# Patient Record
Sex: Male | Born: 1970 | Race: White | Hispanic: No | Marital: Married | Smoking: Current some day smoker
Health system: Southern US, Community
[De-identification: ages and names within clinical notes are randomized; demographics above are authoritative.]

## PROBLEM LIST (undated history)

## (undated) DIAGNOSIS — F411 Generalized anxiety disorder: Secondary | ICD-10-CM

## (undated) DIAGNOSIS — Z973 Presence of spectacles and contact lenses: Secondary | ICD-10-CM

## (undated) DIAGNOSIS — N529 Male erectile dysfunction, unspecified: Secondary | ICD-10-CM

## (undated) DIAGNOSIS — F32A Depression, unspecified: Secondary | ICD-10-CM

## (undated) DIAGNOSIS — M1990 Unspecified osteoarthritis, unspecified site: Secondary | ICD-10-CM

## (undated) DIAGNOSIS — I10 Essential (primary) hypertension: Secondary | ICD-10-CM

## (undated) DIAGNOSIS — E785 Hyperlipidemia, unspecified: Secondary | ICD-10-CM

## (undated) DIAGNOSIS — K219 Gastro-esophageal reflux disease without esophagitis: Secondary | ICD-10-CM

---

## 2009-09-21 HISTORY — PX: BUNIONECTOMY: SHX129

## 2019-06-06 DIAGNOSIS — G5622 Lesion of ulnar nerve, left upper limb: Secondary | ICD-10-CM | POA: Insufficient documentation

## 2019-06-06 DIAGNOSIS — M79641 Pain in right hand: Secondary | ICD-10-CM | POA: Insufficient documentation

## 2019-06-09 ENCOUNTER — Other Ambulatory Visit: Payer: Self-pay

## 2019-06-09 DIAGNOSIS — R202 Paresthesia of skin: Secondary | ICD-10-CM

## 2019-06-15 ENCOUNTER — Ambulatory Visit: Payer: BC Managed Care – PPO | Admitting: Neurology

## 2019-06-15 ENCOUNTER — Other Ambulatory Visit: Payer: Self-pay

## 2019-06-15 DIAGNOSIS — R202 Paresthesia of skin: Secondary | ICD-10-CM | POA: Diagnosis not present

## 2019-06-15 DIAGNOSIS — G5623 Lesion of ulnar nerve, bilateral upper limbs: Secondary | ICD-10-CM

## 2019-06-15 DIAGNOSIS — G5602 Carpal tunnel syndrome, left upper limb: Secondary | ICD-10-CM

## 2019-06-15 NOTE — Procedures (Signed)
[facility]  [place]  [place], [place] [address] Tel: [phone] Fax:  [phone] Test Date:  06/15/2019  Patient: Jay Green DOB: March 31, 1971 Physician: Narda Amber, DO  Sex: Male Height: 6\' 0"  Ref Phys: Charlotte Crumb, MD  ID#: [id number] Temp: 32.0C Technician:    Patient Complaints: This is a 48 year old man referred for evaluation of left hand weakness and muscle atrophy.  NCV & EMG Findings: Extensive electrodiagnostic testing of the left upper extremity and additional studies of the right shows:  1. Left ulnar sensory response shows prolonged latency (3.5 ms) and reduced amplitude (3.5 V).  Left median sensory response shows reduced amplitude (14.8 V) and mixed palmar responses are prolonged.  Right median, ulnar, and mixed palmar sensory responses are within normal limits. 2. Left ulnar motor response at the abductor digit he minimi and first dorsal interosseous muscles shows prolonged latency (L3.2, L4.7 ms), reduced amplitude (L6.6, L1.4 mV), and decreased conduction velocity across the elbow (A Elbow-B Elbow, L42, L40 m/s).  Right ulnar motor response shows normal latency, normal amplitude, and slowed conduction velocity across the elbow  (A Elbow-B Elbow, 40 m/s).  Bilateral median motor responses are within normal limits.   3. Chronic motor axonal loss changes are seen affecting the ulnar innervated muscles on the left, with active denervation isolated to the first dorsal interosseous muscle.  These findings are not present in the right upper extremity.     Impression: 1. 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 2. 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 3. Left median neuropathy at or distal to the wrist (mild), consistent with a clinical diagnosis of carpal tunnel syndrome.    ___________________________  Narda Amber, DO    Nerve Conduction Studies Anti Sensory Summary Table   Site NR Peak (ms) Norm Peak (ms) P-T Amp (V) Norm P-T Amp  Left Median Anti Sensory (2nd Digit)  32C  Wrist    3.4 <3.6 14.8 >15  Right Median Anti Sensory (2nd Digit)  32C  Wrist    3.3 <3.6 16.7 >15  Left Ulnar Anti Sensory (5th Digit)  32C  Wrist    3.5 <3.1 3.5 >10  Right Ulnar Anti Sensory (5th Digit)  32C  Wrist    3.1 <3.1 12.0 >10   Motor Summary Table   Site NR Onset (ms) Norm Onset (ms) O-P Amp (mV) Norm O-P Amp Site1 Site2 Delta-0 (ms) Dist (cm) Vel (m/s) Norm Vel (m/s)  Left Median Motor (Abd Poll Brev)  32C  Wrist    3.4 <4.0 10.0 >6 Elbow Wrist 6.0 35.0 58 >50  Elbow    9.4  9.4         Right Median Motor (Abd Poll Brev)  32C  Wrist    3.5 <4.0 9.2 >6 Elbow Wrist 6.0 33.0 55 >50  Elbow    9.5  8.4         Left Ulnar Motor (Abd Dig Minimi)  32C  Wrist    3.2 <3.1 6.6 >7 B Elbow Wrist 5.3 27.0 51 >50  B Elbow    8.5  5.4  A Elbow B Elbow 2.4 10.0 42 >50  A Elbow    10.9  5.3         Right Ulnar Motor (Abd Dig Minimi)  32C  Wrist    2.1 <3.1 10.5 >7 B Elbow Wrist 5.0 27.0 54 >  50  B Elbow    7.1  9.2  A Elbow B Elbow 2.5 10.0 40 >50  A Elbow    9.6  9.2         Left Ulnar (FDI) Motor (1st DI)  32C  Wrist    4.7 <4.5 1.4 >7 B Elbow Wrist 5.3 27.0 51 >50  B Elbow    10.0  0.9  A Elbow B Elbow 2.5 10.0 40 >50  A Elbow    12.5  0.8          Comparison Summary Table   Site NR Peak (ms) Norm Peak (ms) P-T Amp (V) Site1 Site2 Delta-P (ms) Norm Delta (ms)  Left Median/Ulnar Palm Comparison (Wrist - 8cm)  32C  Median Palm    2.7 <2.2 13.8 Median Palm Ulnar Palm    Ulnar Palm NR  <2.2       Right Median/Ulnar Palm Comparison (Wrist - 8cm)  32C  Median Palm    2.1 <2.2 21.1 Median Palm Ulnar Palm 0.3   Ulnar Palm    1.8 <2.2 9.4       EMG   Side Muscle Ins Act Fibs Psw Fasc Number Recrt Dur Dur. Amp Amp. Poly Poly. Comment  Left 1stDorInt Nml 1+ Nml Nml 3- Rapid All 1+ Nml Nml  Nml Nml N/A  Left ABD Dig Min Nml Nml Nml Nml 3- Rapid Most 1+ Most 1+ Nml Nml N/A  Left FlexCarpiUln Nml Nml Nml Nml 1- Rapid Some 1+ Some 1+ Nml Nml N/A  Left Ext Indicis Nml Nml Nml Nml Nml Nml Nml Nml Nml Nml Nml Nml N/A  Left PronatorTeres Nml Nml Nml Nml Nml Nml Nml Nml Nml Nml Nml Nml N/A  Left Biceps Nml Nml Nml Nml Nml Nml Nml Nml Nml Nml Nml Nml N/A  Left Triceps Nml Nml Nml Nml Nml Nml Nml Nml Nml Nml Nml Nml N/A  Left Deltoid Nml Nml Nml Nml Nml Nml Nml Nml Nml Nml Nml Nml N/A  Right 1stDorInt Nml Nml Nml Nml Nml Nml Nml Nml Nml Nml Nml Nml N/A  Right Ext Indicis Nml Nml Nml Nml Nml Nml Nml Nml Nml Nml Nml Nml N/A  Right PronatorTeres Nml Nml Nml Nml Nml Nml Nml Nml Nml Nml Nml Nml N/A  Right ABD Dig Min Nml Nml Nml Nml Nml Nml Nml Nml Nml Nml Nml Nml N/A  Right FlexCarpiUln Nml Nml Nml Nml Nml Nml Nml Nml Nml Nml Nml Nml N/A  Left Abd Poll Brev Nml Nml Nml Nml Nml Nml Nml Nml Nml Nml Nml Nml N/A      Waveforms:

## 2019-07-23 HISTORY — PX: CARPAL TUNNEL RELEASE: SHX101

## 2021-07-22 DIAGNOSIS — C61 Malignant neoplasm of prostate: Secondary | ICD-10-CM

## 2021-07-22 HISTORY — DX: Malignant neoplasm of prostate: C61

## 2021-10-08 ENCOUNTER — Other Ambulatory Visit: Payer: Self-pay | Admitting: Urology

## 2021-10-08 DIAGNOSIS — C61 Malignant neoplasm of prostate: Secondary | ICD-10-CM

## 2021-10-18 ENCOUNTER — Ambulatory Visit
Admission: RE | Admit: 2021-10-18 | Discharge: 2021-10-18 | Disposition: A | Discharge status: Home / Self Care | Payer: BC Managed Care – PPO | Source: Ambulatory Visit | Attending: Urology | Admitting: Urology

## 2021-10-18 ENCOUNTER — Other Ambulatory Visit: Payer: Self-pay

## 2021-10-18 DIAGNOSIS — C61 Malignant neoplasm of prostate: Secondary | ICD-10-CM

## 2021-10-18 MED ORDER — GADOBENATE DIMEGLUMINE 529 MG/ML IV SOLN
20.0000 mL | Freq: Once | INTRAVENOUS | Status: AC | PRN
  Administered 2021-10-18: 20 mL via INTRAVENOUS

## 2021-12-03 ENCOUNTER — Other Ambulatory Visit (HOSPITAL_COMMUNITY): Payer: Self-pay | Admitting: Urology

## 2021-12-03 ENCOUNTER — Telehealth: Payer: Self-pay | Admitting: Radiation Oncology

## 2021-12-03 DIAGNOSIS — C61 Malignant neoplasm of prostate: Secondary | ICD-10-CM

## 2021-12-03 NOTE — Progress Notes (Incomplete)
GU Location of Tumor / Histology: Prostate Ca ? ?If Prostate Cancer, Gleason Score is (4 + 3) and PSA is (11.7 as of 10/2021) ? ?Biopsies: ?Dr. Alinda Money ? ? ? ? ? ?Past/Anticipated interventions by urology, if any:  ? ?Past/Anticipated interventions by medical oncology, if any:  ? ?Weight changes, if any:  ? ?IPSS: ?SHIM: ? ?Bowel/Bladder complaints, if any:   ? ?Nausea/Vomiting, if any:  ? ?Pain issues, if any:   ? ?SAFETY ISSUES: ?Prior radiation?  ?Pacemaker/ICD?  ?Possible current pregnancy? Male ?Is the patient on methotrexate?  ? ?Current Complaints / other details:   ?

## 2021-12-03 NOTE — Telephone Encounter (Signed)
Called patient to r/s appointment scheduled 3/21 w. Dr. Tammi Klippel. No answer, LVM for a return call.   ?

## 2021-12-08 NOTE — Progress Notes (Signed)
GU Location of Tumor / Histology: Prostate Ca ? ?If Prostate Cancer, Gleason Score is (4 + 3) and PSA is (11.7 as of 09/2021) ? ?Biopsies: ?Dr. Alinda Money ? ? ? ? ? ?Past/Anticipated interventions by urology, if any:  ?NA ?Past/Anticipated interventions by medical oncology, if any: NA ? ?Weight changes, if any:  No ? ?IPSS:  11 ?SHIM:  17 ? ?Bowel/Bladder complaints, if any:  No ? ?Nausea/Vomiting, if any: No ? ?Pain issues, if any:  0/10 ? ?SAFETY ISSUES: ?Prior radiation?  No ?Pacemaker/ICD? No ?Possible current pregnancy? Male ?Is the patient on methotrexate? No ? ?Current Complaints / other details:  Need more information on treatment options. ?

## 2021-12-09 ENCOUNTER — Ambulatory Visit: Payer: BC Managed Care – PPO

## 2021-12-09 ENCOUNTER — Ambulatory Visit: Payer: BC Managed Care – PPO | Admitting: Radiation Oncology

## 2021-12-10 ENCOUNTER — Other Ambulatory Visit: Payer: Self-pay

## 2021-12-10 ENCOUNTER — Encounter (HOSPITAL_COMMUNITY)
Admission: RE | Admit: 2021-12-10 | Discharge: 2021-12-10 | Disposition: A | Discharge status: Home / Self Care | Payer: BC Managed Care – PPO | Source: Ambulatory Visit | Attending: Urology | Admitting: Urology

## 2021-12-10 DIAGNOSIS — C61 Malignant neoplasm of prostate: Secondary | ICD-10-CM | POA: Diagnosis present

## 2021-12-10 MED ORDER — PIFLIFOLASTAT F 18 (PYLARIFY) INJECTION
9.0000 | Freq: Once | INTRAVENOUS | Status: AC
  Administered 2021-12-10: 9.3 via INTRAVENOUS

## 2021-12-16 ENCOUNTER — Ambulatory Visit
Admission: RE | Admit: 2021-12-16 | Discharge: 2021-12-16 | Disposition: A | Discharge status: Home / Self Care | Payer: BC Managed Care – PPO | Source: Ambulatory Visit | Attending: Radiation Oncology | Admitting: Radiation Oncology

## 2021-12-16 ENCOUNTER — Other Ambulatory Visit: Payer: Self-pay

## 2021-12-16 VITALS — BP 135/96 | HR 72 | Temp 98.1°F | Resp 18 | Ht 72.0 in | Wt 217.0 lb

## 2021-12-16 DIAGNOSIS — M19041 Primary osteoarthritis, right hand: Secondary | ICD-10-CM | POA: Insufficient documentation

## 2021-12-16 DIAGNOSIS — Z79899 Other long term (current) drug therapy: Secondary | ICD-10-CM | POA: Insufficient documentation

## 2021-12-16 DIAGNOSIS — F341 Dysthymic disorder: Secondary | ICD-10-CM | POA: Insufficient documentation

## 2021-12-16 DIAGNOSIS — C61 Malignant neoplasm of prostate: Secondary | ICD-10-CM | POA: Insufficient documentation

## 2021-12-16 DIAGNOSIS — K5730 Diverticulosis of large intestine without perforation or abscess without bleeding: Secondary | ICD-10-CM | POA: Diagnosis not present

## 2021-12-16 DIAGNOSIS — I10 Essential (primary) hypertension: Secondary | ICD-10-CM | POA: Insufficient documentation

## 2021-12-16 DIAGNOSIS — K219 Gastro-esophageal reflux disease without esophagitis: Secondary | ICD-10-CM | POA: Insufficient documentation

## 2021-12-16 DIAGNOSIS — K589 Irritable bowel syndrome without diarrhea: Secondary | ICD-10-CM | POA: Insufficient documentation

## 2021-12-16 DIAGNOSIS — E785 Hyperlipidemia, unspecified: Secondary | ICD-10-CM | POA: Insufficient documentation

## 2021-12-16 DIAGNOSIS — R251 Tremor, unspecified: Secondary | ICD-10-CM | POA: Insufficient documentation

## 2021-12-16 DIAGNOSIS — R748 Abnormal levels of other serum enzymes: Secondary | ICD-10-CM | POA: Insufficient documentation

## 2021-12-16 NOTE — Progress Notes (Signed)
Introduced myself to the patient, and his wife, as the prostate nurse navigator.  No barriers to care identified at this time.  He is here to discuss his radiation treatment options, and is most interested in boost brachytherapy, followed by 5.5 weeks of EBRT. He verbalized concerns with applying for short term disability, I have provided patient with contact information to submit paperwork. I gave him my business card and asked him to call me with questions or concerns.  Verbalized understanding.  ?

## 2021-12-16 NOTE — Progress Notes (Signed)
?Radiation Oncology         (336) [phone] ?________________________________ ? ?Initial Outpatient Consultation ? ?Name: Jay Green MRN: [id number]  ?Date: 12/16/2021  DOB: 1970-10-18 ? ?YQ:MVHQIO, Provider Not In  Raynelle Bring, MD  ? ?REFERRING PHYSICIAN: Raynelle Bring, MD ? ?DIAGNOSIS: 51 y.o. gentleman with Stage T1c adenocarcinoma of the prostate with Gleason score of 3+4, and PSA of 11.7. ? ?  ICD-10-CM   ?1. Malignant neoplasm of prostate ([place])  C61   ?  ? ? ?HISTORY OF PRESENT ILLNESS: Jay Green is a 51 y.o. male with a diagnosis of prostate cancer. He was initially diagnosed with low volume Gleason 3+3 prostate cancer in 2/12 cores on by Dr. Joya Gaskins in [place], [place] on 08/18/21. PSA at that time was 16.7. He was subsequently referred to Dr. Alinda Money on 10/07/21 to discuss prostatectomy, digital rectal examination was performed at that time revealing no nodules. Repeat PSA from that day remained elevated with a slight decrease to 11.7. Given his significantly elevated PSA, Dr. Alinda Money recommended prostate MRI to rule out more significant disease. The prostate MRI was performed on 10/18/21 and showed a 1.4 cm PI-RADS 4 in the right lateral mid gland peripheral zone as well as a 2.9 cm PI-RADS 5 lesion involving the left anterior transition zone and fibromuscular stroma in the mid gland and base without any definite evidence of extracapsular extension or pelvic metastatic disease. The patient proceeded to MRI fusion biopsy of the prostate on 2/29/23.  The prostate volume measured 24.2 cc.  Out of 20 core biopsies, 16 were positive.  The maximum Gleason score was 3+4, and this was seen in the right mid lateral, right base, and all four samples from ROI #1. Additionally, Gleason 3+3 was seen in three (of four) cores from ROI #2, as well as the left base lateral, left mid lateral (small focus), left apex lateral, left mid, left apex, right mid, and right apex lateral (small focus). ? ?8 PSMA PET scan was performed on  12/10/21 for disease staging and this showed two foci of abnormal radiotracer uptake in the prostate gland corresponding with the suspicious lesions seen on prior MRI.  There was no abnormal extraprostatic radiotracer activity to suggest metastatic disease. ? ?The patient reviewed the biopsy results with his urologist and he has kindly been referred today for discussion of potential radiation treatment options. ? ? ?PREVIOUS RADIATION THERAPY: No ? ?PAST MEDICAL HISTORY: History reviewed. No pertinent past medical history.   ? ?PAST SURGICAL HISTORY:History reviewed. No pertinent surgical history. ? ?FAMILY HISTORY: History reviewed. No pertinent family history. ? ?SOCIAL HISTORY: He works full-time in Theatre manager for [employer].  He also enjoys gardening and working on the farm. ?Social History  ? ?Socioeconomic History  ? Marital status: Married  ?  Spouse name: Not on file  ? Number of children: Not on file  ? Years of education: Not on file  ? Highest education level: Not on file  ?Occupational History  ? Not on file  ?Tobacco Use  ? Smoking status: Not on file  ? Smokeless tobacco: Not on file  ?Substance and Sexual Activity  ? Alcohol use: Not on file  ? Drug use: Not on file  ? Sexual activity: Not on file  ?Other Topics Concern  ? Not on file  ?Social History Narrative  ? Not on file  ? ?Social Determinants of Health  ? ?Financial Resource Strain: Not on file  ?Food Insecurity: Not on file  ?Transportation Needs: Not on  file  ?Physical Activity: Not on file  ?Stress: Not on file  ?Social Connections: Not on file  ?Intimate Partner Violence: Not on file  ? ? ?ALLERGIES: Lisinopril and Sulfamethoxazole-trimethoprim ? ?MEDICATIONS:  ?Current Outpatient Medications  ?Medication Sig Dispense Refill  ? amLODipine (NORVASC) 5 MG tablet Take 5 mg by mouth daily.    ? clonazePAM (KLONOPIN) 0.5 MG tablet Take by mouth.    ? escitalopram (LEXAPRO) 10 MG tablet Take 10 mg by mouth daily.    ? omeprazole (PRILOSEC) 40 MG  capsule Take by mouth.    ? rosuvastatin (CRESTOR) 10 MG tablet Take 10 mg by mouth daily.    ? ?No current facility-administered medications for this encounter.  ? ? ?REVIEW OF SYSTEMS:  On review of systems, the patient reports that he is doing well overall. He denies any chest pain, shortness of breath, cough, fevers, chills, night sweats, unintended weight changes. He denies any bowel disturbances, and denies abdominal pain, nausea or vomiting. He denies any new musculoskeletal or joint aches or pains. His IPSS was 11, indicating mild-moderate urinary symptoms. His SHIM was 17, indicating he has moderate erectile dysfunction. A complete review of systems is obtained and is otherwise negative. ? ?  ?PHYSICAL EXAM:  ?Wt Readings from Last 3 Encounters:  ?12/16/21 217 lb (98.4 kg)  ?12/10/21 210 lb 6.9 oz (95.5 kg)  ? ?Temp Readings from Last 3 Encounters:  ?12/16/21 98.1 ?F (36.7 ?C)  ? ?BP Readings from Last 3 Encounters:  ?12/16/21 (!) 135/96  ? ?Pulse Readings from Last 3 Encounters:  ?12/16/21 72  ? ?Pain Assessment ?Pain Score: 0-No pain/10 ? ?In general this is a well appearing Caucasian male in no acute distress. He's alert and oriented x4 and appropriate throughout the examination. Cardiopulmonary assessment is negative for acute distress, and he exhibits normal effort.   ? ? ?KPS = 100 ? ?100 - Normal; no complaints; no evidence of disease. ?90   - Able to carry on normal activity; minor signs or symptoms of disease. ?80   - Normal activity with effort; some signs or symptoms of disease. ?106   - Cares for self; unable to carry on normal activity or to do active work. ?60   - Requires occasional assistance, but is able to care for most of his personal needs. ?50   - Requires considerable assistance and frequent medical care. ?43   - Disabled; requires special care and assistance. ?30   - Severely disabled; hospital admission is indicated although death not imminent. ?20   - Very sick; hospital admission  necessary; active supportive treatment necessary. ?10   - Moribund; fatal processes progressing rapidly. ?0     - Dead ? ?Karnofsky DA, Abelmann WH, Craver LS and Burchenal [facility] [phone]) The use of the nitrogen mustards in the palliative treatment of carcinoma: with particular reference to bronchogenic carcinoma Cancer 1 634-56 ? ?LABORATORY DATA:  ?No results found for: WBC, HGB, HCT, MCV, PLT ?No results found for: NA, K, CL, CO2 ?No results found for: ALT, AST, GGT, ALKPHOS, BILITOT ?  ?RADIOGRAPHY: NM PET (PSMA) SKULL TO MID THIGH ? ?Result Date: 12/12/2021 ?CLINICAL DATA:  Initial treatment strategy for tumor type. EXAM: NUCLEAR MEDICINE PET SKULL BASE TO THIGH TECHNIQUE: 9.3 mCi F18 Piflufolastat (Pylarify) was injected intravenously. Full-ring PET imaging was performed from the skull base to thigh after the radiotracer. CT data was obtained and used for attenuation correction and anatomic localization. COMPARISON:  MRI October 18, 2021 FINDINGS: Background  hepatic uptake demonstrated mass SUV of 8.2. Background parotid gland uptake demonstrates a Jay SUV of 15.7 NECK No radiotracer activity in neck lymph nodes. Incidental CT finding: Streak artifact from dental hardware CHEST No radiotracer accumulation within mediastinal or hilar lymph nodes. No suspicious pulmonary nodules on the CT scan. Incidental CT finding: None ABDOMEN/PELVIS Prostate: There are 2 intense foci of abnormal radiotracer uptake in the prostate gland, with uptake similar to that of background liver, both of which appear to correspond with the suspicious PI-RADS 4/5 nodules seen on prior MRI. The Jay SUV of the focus in the left gland is 8.6 in the Jay SUV of the focus in the right gland a 7 point Lymph nodes: No abnormal radiotracer accumulation within pelvic or abdominal nodes. Liver: No evidence of liver metastasis Incidental CT finding: Unremarkable noncontrast appearance of the hepatic, pancreatic and splenic parenchyma. No hydronephrosis.  Scattered colonic diverticulosis without findings of acute diverticulitis. SKELETON No focal  activity to suggest skeletal metastasis. IMPRESSION: 1. Two foci of abnormal radiotracer uptake in the pros

## 2021-12-19 ENCOUNTER — Telehealth: Payer: Self-pay | Admitting: *Deleted

## 2021-12-19 NOTE — Telephone Encounter (Signed)
CALLED PATIENT TO UPDATE, SPOKE WITH PATIENT 

## 2021-12-21 ENCOUNTER — Encounter: Payer: Self-pay | Admitting: Radiation Oncology

## 2021-12-31 ENCOUNTER — Telehealth: Payer: Self-pay | Admitting: *Deleted

## 2021-12-31 NOTE — Telephone Encounter (Signed)
Called patient to inform of implant date of 03/19/22 @ 10:30 am, lvm for a return call ?

## 2022-01-01 ENCOUNTER — Other Ambulatory Visit: Payer: Self-pay | Admitting: Urology

## 2022-01-14 ENCOUNTER — Telehealth: Payer: Self-pay | Admitting: *Deleted

## 2022-01-14 NOTE — Telephone Encounter (Signed)
RETURNED PATIENT'S PHONE CALL, SPOKE WITH PATIENT. ?

## 2022-01-14 NOTE — Telephone Encounter (Signed)
CALLED PATIENT TO GIVE INFO., LVM FOR A RETURN CALL 

## 2022-01-21 ENCOUNTER — Telehealth: Payer: Self-pay | Admitting: *Deleted

## 2022-01-21 NOTE — Telephone Encounter (Signed)
CALLED PATIENT TO REMIND OF PRE-SEED APPTS. FOR 01-22-22, SPOKE WITH PATIENT'S WIFE- Jay Green AND SHE IS AWARE OF THESE APPTS. ?

## 2022-01-22 ENCOUNTER — Encounter (HOSPITAL_COMMUNITY)
Admission: RE | Admit: 2022-01-22 | Discharge: 2022-01-22 | Disposition: A | Discharge status: Home / Self Care | Payer: BC Managed Care – PPO | Source: Ambulatory Visit | Attending: Urology | Admitting: Urology

## 2022-01-22 ENCOUNTER — Encounter: Payer: Self-pay | Admitting: Urology

## 2022-01-22 ENCOUNTER — Ambulatory Visit
Admission: RE | Admit: 2022-01-22 | Discharge: 2022-01-22 | Disposition: A | Discharge status: Home / Self Care | Payer: BC Managed Care – PPO | Source: Ambulatory Visit | Attending: Urology | Admitting: Urology

## 2022-01-22 ENCOUNTER — Other Ambulatory Visit: Payer: Self-pay

## 2022-01-22 ENCOUNTER — Ambulatory Visit
Admission: RE | Admit: 2022-01-22 | Discharge: 2022-01-22 | Disposition: A | Discharge status: Home / Self Care | Payer: BC Managed Care – PPO | Source: Ambulatory Visit | Attending: Radiation Oncology | Admitting: Radiation Oncology

## 2022-01-22 VITALS — Resp 19 | Ht 72.0 in | Wt 212.8 lb

## 2022-01-22 DIAGNOSIS — C61 Malignant neoplasm of prostate: Secondary | ICD-10-CM

## 2022-01-22 DIAGNOSIS — I10 Essential (primary) hypertension: Secondary | ICD-10-CM | POA: Insufficient documentation

## 2022-01-22 DIAGNOSIS — Z01810 Encounter for preprocedural cardiovascular examination: Secondary | ICD-10-CM | POA: Insufficient documentation

## 2022-01-22 NOTE — Progress Notes (Addendum)
Pre-Seed appointment. I verified patient identity and began nursing interview. ?Patient is doing well. No prostate related issues or discomfort reported at this time. ? ?Meaningful use complete. ?No urinary management medications currently. ?Urology appointment March 19, 2022- per patient. ? ?Resp 19   Ht 6' (1.829 m)   Wt 212 lb 12.8 oz (96.5 kg)   BMI 28.86 kg/m?  ? ?

## 2022-01-22 NOTE — Progress Notes (Signed)
?  Radiation Oncology         (336) [phone] ?________________________________ ? ?Name: Jay Green MRN: [id number]  ?Date: 01/22/2022  DOB: 1971-06-16 ? ?SIMULATION AND TREATMENT PLANNING NOTE ?PUBIC ARCH STUDY ? ?RS:WNIOEV, Provider Not In  Raynelle Bring, MD ? ?DIAGNOSIS:  51 y.o. gentleman with Stage T1c adenocarcinoma of the prostate with Gleason score of 3+4, and PSA of 11.7. ? ?Oncology History  ?Malignant neoplasm of prostate ([place])  ?11/13/2021 Cancer Staging  ? Staging form: Prostate, AJCC 8th Edition ?- Clinical stage from 11/13/2021: Stage IIB (cT1c, cN0, cM0, PSA: 11.7, Grade Group: 2) - Signed by Freeman Caldron, PA-C on 12/16/2021 ?Histopathologic type: Adenocarcinoma, NOS ?Stage prefix: Initial diagnosis ?Prostate specific antigen (PSA) range: 10 to 19 ?Gleason primary pattern: 3 ?Gleason secondary pattern: 4 ?Gleason score: 7 ?Histologic grading system: 5 grade system ?Number of biopsy cores examined: 20 ?Number of biopsy cores positive: 16 ?Location of positive needle core biopsies: Both sides ? ?  ?12/16/2021 Initial Diagnosis  ? Malignant neoplasm of prostate ([place]) ? ?  ? ? ?  ICD-10-CM   ?1. Malignant neoplasm of prostate ([place])  C61   ?  ? ? ?COMPLEX SIMULATION:  The patient presented today for evaluation for possible prostate seed implant. He was brought to the radiation planning suite and placed supine on the CT couch. A 3-dimensional image study set was obtained in upload to the planning computer. There, on each axial slice, I contoured the prostate gland. Then, using three-dimensional radiation planning tools I reconstructed the prostate in view of the structures from the transperineal needle pathway to assess for possible pubic arch interference. In doing so, I did not appreciate any pubic arch interference. Also, the patient's prostate volume was estimated based on the drawn structure. The volume was 24 cc.  Given the pubic arch appearance and prostate volume, patient remains a good candidate to  proceed with prostate seed implant. Today, he freely provided informed written consent to proceed. ? ? ? ?PLAN: The patient will undergo prostate seed implant. ? ? ?________________________________ ? ?Sheral Apley Tammi Klippel, M.D. ? ? ? ? ?

## 2022-01-23 ENCOUNTER — Telehealth: Payer: Self-pay | Admitting: *Deleted

## 2022-01-23 NOTE — Telephone Encounter (Signed)
CALLED PATIENT TO ASK IF HE HAS ANY QUESTIONS REGARDING INFO THAT HE WAS GIVEN YESTERDAY, SPOKE WITH PATIENT ?

## 2022-03-13 ENCOUNTER — Encounter (HOSPITAL_BASED_OUTPATIENT_CLINIC_OR_DEPARTMENT_OTHER): Payer: Self-pay | Admitting: Urology

## 2022-03-18 ENCOUNTER — Telehealth: Payer: Self-pay | Admitting: *Deleted

## 2022-03-18 NOTE — Telephone Encounter (Signed)
CALLED PATIENT TO REMIND OF PROCEDURE FOR 03-19-22, SPOKE WITH PATIENT AND HE IS AWARE OF THIS PROCEDURE

## 2022-03-18 NOTE — H&P (Signed)
Office Visit Report     03/03/2022   --------------------------------------------------------------------------------   Jay Green  MRN: [id number]  DOB: 07-27-1971, 51 year old Male  SSN:    PRIMARY CARE:    REFERRING:  Burnett Harry. Joya Gaskins, MD  PROVIDER:  Raynelle Bring, M.D.  TREATING:  Mcarthur Rossetti, [place]  LOCATION:  [facility] [phone]     --------------------------------------------------------------------------------   CC/HPI: Pt presents today for pre-operative history and physical exam in anticipation of space oar, radiation seed placement, and cysto by Dr. Alinda Money on 03/19/22. He is doing well and is without complaint.   Pt denies F/C, HA, CP, SOB, N/V, diarrhea/constipation, back pain, flank pain, hematuria, and dysuria.    HX:   CC: Prostate cancer   Physician requesting consult: Dr. Harle Stanford  PCP: Dr. Haynes Dage   Mr. Jay Green is a 51 year old gentleman who was noted to have a persistently elevated PSA of 16.7 in October of 2022 prompting a TRUS biopsy of the prostate on 08/19/21. This indicated Gleason 3+3=6 adenocarcinoma in 10% of the tissue from the left apical biopsies. He was seen by me in consultation in January 2023 after discussion, he wished to proceed with additional diagnostic evaluation. An MRI of the prostate on 10/18/21 that demonstrated two concerning lesions including a large anterior PI-RADS 5 lesion. An MR/US fusion biopsy was then performed on 11/13/21 and confirmed Gleason 3+4=7 adenocarcinoma with 7 of 8 targeted biopsies positive but also 9 out of 12 systematic biopsies positive for malignancy. His repeated PSA on 10/07/21 was 11.7.   Family history: None.   Imaging studies: MRI (10/18/21): No EPE, SVI, LAD, or bone lesions.   PMH: He has a history of GERD, hypertension, depression, and hyperlipidemia.  PSH: No abdominal surgeries.   TNM stage: cT1c Nx Mx  PSA: 11.7  Gleason score: 3+4=7 (GG 2)  Biopsy  (11/13/21): 16/20 cores positive  Left: L lateral apex (80%, 3+3=6), L apex (60%, 3+3=6), L lateral mid (5%, 3+3=6), L mid (90%, 3+3=6), L lateral base (30%, 3+3=6)  Right: R lateral apex (5%, 3+3=6), R mid (50%, 3+3=6), R lateral mid (30%, 3+4=7), R base (40%, 3+4=7)  MR targets: ROI-1 (4/4 cores - 40%, 50%, 40%, 40%, 3+4=7), ROI-2 (3/4 cores - 90%, 50%, 50%, 3+3=6)  Prostate volume: 24.2 cc   Nomogram  OC disease: 35%  EPE: 63%  SVI: 11%  LNI: 11%  PFS (5 year, 10 year): 67%, 52%   Urinary function: IPSS is 3.  Erectile function: SHIM score is 17. He can achieve erections for intercourse most of the time but not always. He has recently started using sildenafil and believes this is helpful.     ALLERGIES: No Known Drug Allergies    MEDICATIONS: Omeprazole 40 mg capsule,delayed release  Amlodipine Besylate 5 mg tablet  Escitalopram Oxalate 10 mg tablet  Rosuvastatin Calcium 10 mg tablet     GU PSH: Prostate Needle Biopsy - 11/13/2021       PSH Notes: left foot bunion and hammer toe repair 15 years ago    NON-GU PSH: Carpal tunnel surgery - 2021 Surgical Pathology, Gross And Microscopic Examination For Prostate Needle - 11/13/2021     GU PMH: Prostate Cancer - 11/28/2021, - 11/13/2021, - 10/07/2021    NON-GU PMH: Anxiety GERD Hypercholesterolemia Hypertension    FAMILY HISTORY: 2 sons - Runs in Family   SOCIAL HISTORY: Marital Status: Married Ethnicity: Not Hispanic Or Latino; Race: White Current Smoking Status: Patient  does not smoke anymore. Has not smoked since 09/21/2013. Smoked for 20 years.   Tobacco Use Assessment Completed: Used Tobacco in last 30 days? Uses smokeless tobacco. Does drink.  Does not use drugs. Drinks 4+ caffeinated drinks per day. Has not had a blood transfusion. Patient's occupation Tour manager.     Notes: vaping daily  chew tobacco 1 can every 3-4 days  ETOH whiskey 1/2 gal per week; beer 6 pack per week    REVIEW OF  SYSTEMS:    GU Review Male:   Patient denies frequent urination, hard to postpone urination, burning/ pain with urination, get up at night to urinate, leakage of urine, stream starts and stops, trouble starting your stream, have to strain to urinate , erection problems, and penile pain.  Gastrointestinal (Upper):   Patient reports indigestion/ heartburn. Patient denies nausea and vomiting.  Gastrointestinal (Lower):   Patient denies diarrhea and constipation.  Constitutional:   Patient denies fever, night sweats, weight loss, and fatigue.  Skin:   Patient denies skin rash/ lesion and itching.  Eyes:   Patient denies blurred vision and double vision.  Ears/ Nose/ Throat:   Patient denies sore throat and sinus problems.  Hematologic/Lymphatic:   Patient denies swollen glands and easy bruising.  Cardiovascular:   Patient denies leg swelling and chest pains.  Respiratory:   Patient denies cough and shortness of breath.  Endocrine:   Patient denies excessive thirst.  Musculoskeletal:   Patient denies back pain and joint pain.  Neurological:   Patient denies headaches and dizziness.  Psychologic:   Patient denies anxiety and depression.   VITAL SIGNS:      03/03/2022 01:41 PM  Weight 210 lb / 95.25 kg  Height 72 in / 182.88 cm  BP 146/90 mmHg  Heart Rate 63 /min  Temperature 97.7 F / 36.5 C  BMI 28.5 kg/m   MULTI-SYSTEM PHYSICAL EXAMINATION:    Constitutional: Well-nourished. No physical deformities. Normally developed. Good grooming.  Neck: Neck symmetrical, not swollen. Normal tracheal position.  Respiratory: Normal breath sounds. No labored breathing, no use of accessory muscles.   Cardiovascular: Regular rate and rhythm. No murmur, no gallop.   Lymphatic: No enlargement of neck, axillae, groin.  Skin: No paleness, no jaundice, no cyanosis. No lesion, no ulcer, no rash.  Neurologic / Psychiatric: Oriented to time, oriented to place, oriented to person. No depression, no anxiety, no  agitation.  Gastrointestinal: No mass, no tenderness, no rigidity, non obese abdomen.  Eyes: Normal conjunctivae. Normal eyelids.  Ears, Nose, Mouth, and Throat: Left ear no scars, no lesions, no masses. Right ear no scars, no lesions, no masses. Nose no scars, no lesions, no masses. Normal hearing. Normal lips.  Musculoskeletal: Normal gait and station of head and neck.     Complexity of Data:  Records Review:   Previous Patient Records  Urine Test Review:   Urinalysis   03/03/22  Urinalysis  Urine Appearance Clear   Urine Color Yellow   Urine Glucose Neg mg/dL  Urine Bilirubin Neg mg/dL  Urine Ketones Neg mg/dL  Urine Specific Gravity 1.025   Urine Blood Neg ery/uL  Urine pH 5.5   Urine Protein Neg mg/dL  Urine Urobilinogen 0.2 mg/dL  Urine Nitrites Neg   Urine Leukocyte Esterase Neg leu/uL   PROCEDURES:          Urinalysis - 81003 Dipstick Dipstick Cont'd  Color: Yellow Bilirubin: Neg mg/dL  Appearance: Clear Ketones: Neg mg/dL  Specific Gravity: 1.025 Blood: Neg  ery/uL  pH: 5.5 Protein: Neg mg/dL  Glucose: Neg mg/dL Urobilinogen: 0.2 mg/dL    Nitrites: Neg    Leukocyte Esterase: Neg leu/uL    ASSESSMENT:      ICD-10 Details  1 GU:   Prostate Cancer - C61    PLAN:            Medications Stop Meds: Diazepam 10 mg tablet Take 10 mg 30-60 minutes prior to your procedure  Start: 10/21/2021  Discontinue: 03/03/2022  - Reason: The medication cycle was completed.  Levofloxacin 750 mg tablet Please take one tablet the morning of your biopsy.  Start: 10/21/2021  Discontinue: 03/03/2022  - Reason: The medication cycle was completed.            Schedule Return Visit/Planned Activity: Keep Scheduled Appointment - Schedule Surgery          Document Letter(s):  Created for Patient: Clinical Summary         Notes:   There are no changes in the patients history or physical exam since last evaluation by Dr. Alinda Money. Pt is scheduled to undergo cysto, space oar, and  radiation seed implant on 03/19/22.   All pt's questions were answered to the best of my ability.          Next Appointment:      Next Appointment: 03/19/2022 10:30 AM    Appointment Type: Surgery     Location: [facility] [phone]    Provider: Raynelle Bring, M.D.    Reason for Visit: NE/OP CYSTO, RAD SEED, SPACE OAR      * Signed by Mcarthur Rossetti, PA on 03/03/22 at 2:34 PM (EDT*

## 2022-03-19 ENCOUNTER — Encounter (HOSPITAL_BASED_OUTPATIENT_CLINIC_OR_DEPARTMENT_OTHER): Payer: Self-pay | Admitting: Urology

## 2022-03-19 ENCOUNTER — Ambulatory Visit (HOSPITAL_BASED_OUTPATIENT_CLINIC_OR_DEPARTMENT_OTHER)
Admission: RE | Admit: 2022-03-19 | Discharge: 2022-03-19 | Disposition: A | Discharge status: Home / Self Care | Payer: BC Managed Care – PPO | Source: Ambulatory Visit | Attending: Urology | Admitting: Urology

## 2022-03-19 ENCOUNTER — Ambulatory Visit (HOSPITAL_BASED_OUTPATIENT_CLINIC_OR_DEPARTMENT_OTHER): Payer: BC Managed Care – PPO | Admitting: Anesthesiology

## 2022-03-19 ENCOUNTER — Ambulatory Visit (HOSPITAL_COMMUNITY): Payer: BC Managed Care – PPO

## 2022-03-19 ENCOUNTER — Encounter (HOSPITAL_BASED_OUTPATIENT_CLINIC_OR_DEPARTMENT_OTHER)
Admission: RE | Disposition: A | Discharge status: Home / Self Care | Payer: Self-pay | Source: Ambulatory Visit | Attending: Urology

## 2022-03-19 ENCOUNTER — Other Ambulatory Visit: Payer: Self-pay

## 2022-03-19 DIAGNOSIS — C61 Malignant neoplasm of prostate: Secondary | ICD-10-CM | POA: Insufficient documentation

## 2022-03-19 DIAGNOSIS — I10 Essential (primary) hypertension: Secondary | ICD-10-CM

## 2022-03-19 DIAGNOSIS — E785 Hyperlipidemia, unspecified: Secondary | ICD-10-CM | POA: Diagnosis not present

## 2022-03-19 HISTORY — PX: CYSTOSCOPY: SHX5120

## 2022-03-19 HISTORY — DX: Presence of spectacles and contact lenses: Z97.3

## 2022-03-19 HISTORY — DX: Male erectile dysfunction, unspecified: N52.9

## 2022-03-19 HISTORY — DX: Generalized anxiety disorder: F41.1

## 2022-03-19 HISTORY — DX: Unspecified osteoarthritis, unspecified site: M19.90

## 2022-03-19 HISTORY — DX: Depression, unspecified: F32.A

## 2022-03-19 HISTORY — DX: Gastro-esophageal reflux disease without esophagitis: K21.9

## 2022-03-19 HISTORY — PX: RADIOACTIVE SEED IMPLANT: SHX5150

## 2022-03-19 HISTORY — DX: Essential (primary) hypertension: I10

## 2022-03-19 HISTORY — DX: Hyperlipidemia, unspecified: E78.5

## 2022-03-19 HISTORY — PX: SPACE OAR INSTILLATION: SHX6769

## 2022-03-19 LAB — POCT I-STAT, CHEM 8
BUN: 9 mg/dL (ref 6–20)
Calcium, Ion: 1.21 mmol/L (ref 1.15–1.40)
Chloride: 101 mmol/L (ref 98–111)
Creatinine, Ser: 0.8 mg/dL (ref 0.61–1.24)
Glucose, Bld: 105 mg/dL — ABNORMAL HIGH (ref 70–99)
HCT: 45 % (ref 39.0–52.0)
Hemoglobin: 15.3 g/dL (ref 13.0–17.0)
Potassium: 3.8 mmol/L (ref 3.5–5.1)
Sodium: 140 mmol/L (ref 135–145)
TCO2: 23 mmol/L (ref 22–32)

## 2022-03-19 SURGERY — INSERTION, RADIATION SOURCE, PROSTATE
Anesthesia: General

## 2022-03-19 MED ORDER — LIDOCAINE 2% (20 MG/ML) 5 ML SYRINGE
INTRAMUSCULAR | Status: DC | PRN
  Administered 2022-03-19: 60 mg via INTRAVENOUS
  Administered 2022-03-19: 100 mg via INTRAVENOUS

## 2022-03-19 MED ORDER — SODIUM CHLORIDE 0.9 % IV SOLN
INTRAVENOUS | Status: AC | PRN
  Administered 2022-03-19: 1000 mL

## 2022-03-19 MED ORDER — LACTATED RINGERS IV SOLN: INTRAVENOUS | Status: DC

## 2022-03-19 MED ORDER — EPHEDRINE 5 MG/ML INJ
INTRAVENOUS | Status: AC
  Filled 2022-03-19: qty 5

## 2022-03-19 MED ORDER — FENTANYL CITRATE (PF) 100 MCG/2ML IJ SOLN
INTRAMUSCULAR | Status: AC
  Filled 2022-03-19: qty 2

## 2022-03-19 MED ORDER — OXYCODONE HCL 5 MG PO TABS: 5.0000 mg | ORAL_TABLET | Freq: Once | ORAL | Status: DC | PRN

## 2022-03-19 MED ORDER — FENTANYL CITRATE (PF) 100 MCG/2ML IJ SOLN
INTRAMUSCULAR | Status: DC | PRN
  Administered 2022-03-19: 100 ug via INTRAVENOUS

## 2022-03-19 MED ORDER — ACETAMINOPHEN 500 MG PO TABS
1000.0000 mg | ORAL_TABLET | Freq: Once | ORAL | Status: AC
  Administered 2022-03-19: 1000 mg via ORAL

## 2022-03-19 MED ORDER — DOCUSATE SODIUM 100 MG PO CAPS: 100.0000 mg | ORAL_CAPSULE | Freq: Two times a day (BID) | ORAL | 0 refills | Status: DC

## 2022-03-19 MED ORDER — CIPROFLOXACIN IN D5W 400 MG/200ML IV SOLN
400.0000 mg | INTRAVENOUS | Status: AC
  Administered 2022-03-19: 400 mg via INTRAVENOUS

## 2022-03-19 MED ORDER — DEXMEDETOMIDINE (PRECEDEX) IN NS 20 MCG/5ML (4 MCG/ML) IV SYRINGE
PREFILLED_SYRINGE | INTRAVENOUS | Status: DC | PRN
  Administered 2022-03-19: 12 ug via INTRAVENOUS
  Administered 2022-03-19: 8 ug via INTRAVENOUS

## 2022-03-19 MED ORDER — CIPROFLOXACIN IN D5W 400 MG/200ML IV SOLN
INTRAVENOUS | Status: AC
  Filled 2022-03-19: qty 200

## 2022-03-19 MED ORDER — OXYCODONE HCL 5 MG/5ML PO SOLN: 5.0000 mg | Freq: Once | ORAL | Status: DC | PRN

## 2022-03-19 MED ORDER — AMISULPRIDE (ANTIEMETIC) 5 MG/2ML IV SOLN: 10.0000 mg | Freq: Once | INTRAVENOUS | Status: DC | PRN

## 2022-03-19 MED ORDER — EPHEDRINE SULFATE (PRESSORS) 50 MG/ML IJ SOLN
INTRAMUSCULAR | Status: DC | PRN
  Administered 2022-03-19: 10 mg via INTRAVENOUS
  Administered 2022-03-19: 15 mg via INTRAVENOUS

## 2022-03-19 MED ORDER — PROPOFOL 10 MG/ML IV BOLUS
INTRAVENOUS | Status: AC
  Filled 2022-03-19: qty 20

## 2022-03-19 MED ORDER — SODIUM CHLORIDE (PF) 0.9 % IJ SOLN
INTRAMUSCULAR | Status: DC | PRN
  Administered 2022-03-19: 10 mL

## 2022-03-19 MED ORDER — GLYCOPYRROLATE 0.2 MG/ML IJ SOLN
INTRAMUSCULAR | Status: DC | PRN
  Administered 2022-03-19: .2 mg via INTRAVENOUS

## 2022-03-19 MED ORDER — STERILE WATER FOR IRRIGATION IR SOLN
Status: DC | PRN
  Administered 2022-03-19: 500 mL

## 2022-03-19 MED ORDER — TRAMADOL HCL 50 MG PO TABS: 50.0000 mg | ORAL_TABLET | Freq: Four times a day (QID) | ORAL | 0 refills | Status: DC | PRN

## 2022-03-19 MED ORDER — ONDANSETRON HCL 4 MG/2ML IJ SOLN
INTRAMUSCULAR | Status: DC | PRN
  Administered 2022-03-19: 4 mg via INTRAVENOUS

## 2022-03-19 MED ORDER — HYDROMORPHONE HCL 1 MG/ML IJ SOLN: 0.2500 mg | INTRAMUSCULAR | Status: DC | PRN

## 2022-03-19 MED ORDER — PROPOFOL 10 MG/ML IV BOLUS
INTRAVENOUS | Status: DC | PRN
  Administered 2022-03-19: 200 mg via INTRAVENOUS

## 2022-03-19 MED ORDER — ONDANSETRON HCL 4 MG/2ML IJ SOLN
INTRAMUSCULAR | Status: AC
  Filled 2022-03-19: qty 2

## 2022-03-19 MED ORDER — DEXAMETHASONE SODIUM PHOSPHATE 10 MG/ML IJ SOLN
INTRAMUSCULAR | Status: DC | PRN
  Administered 2022-03-19: 10 mg via INTRAVENOUS

## 2022-03-19 MED ORDER — FLEET ENEMA 7-19 GM/118ML RE ENEM: 1.0000 | ENEMA | Freq: Once | RECTAL | Status: DC

## 2022-03-19 MED ORDER — PHENYLEPHRINE 80 MCG/ML (10ML) SYRINGE FOR IV PUSH (FOR BLOOD PRESSURE SUPPORT)
PREFILLED_SYRINGE | INTRAVENOUS | Status: DC | PRN
  Administered 2022-03-19: 80 ug via INTRAVENOUS

## 2022-03-19 MED ORDER — ONDANSETRON HCL 4 MG/2ML IJ SOLN: 4.0000 mg | Freq: Once | INTRAMUSCULAR | Status: DC | PRN

## 2022-03-19 MED ORDER — ACETAMINOPHEN 500 MG PO TABS
ORAL_TABLET | ORAL | Status: AC
  Filled 2022-03-19: qty 2

## 2022-03-19 MED ORDER — ROCURONIUM BROMIDE 10 MG/ML (PF) SYRINGE
PREFILLED_SYRINGE | INTRAVENOUS | Status: DC | PRN
  Administered 2022-03-19: 90 mg via INTRAVENOUS

## 2022-03-19 MED ORDER — TAMSULOSIN HCL 0.4 MG PO CAPS: 0.4000 mg | ORAL_CAPSULE | Freq: Every day | ORAL | 0 refills | Status: AC

## 2022-03-19 MED ORDER — MIDAZOLAM HCL 5 MG/5ML IJ SOLN
INTRAMUSCULAR | Status: DC | PRN
  Administered 2022-03-19: 2 mg via INTRAVENOUS

## 2022-03-19 MED ORDER — MIDAZOLAM HCL 2 MG/2ML IJ SOLN
INTRAMUSCULAR | Status: AC
  Filled 2022-03-19: qty 2

## 2022-03-19 MED ORDER — LIDOCAINE HCL (PF) 2 % IJ SOLN
INTRAMUSCULAR | Status: AC
  Filled 2022-03-19: qty 10

## 2022-03-19 MED ORDER — DEXMEDETOMIDINE HCL IN NACL 80 MCG/20ML IV SOLN
INTRAVENOUS | Status: AC
  Filled 2022-03-19: qty 20

## 2022-03-19 MED ORDER — DEXAMETHASONE SODIUM PHOSPHATE 10 MG/ML IJ SOLN
INTRAMUSCULAR | Status: AC
  Filled 2022-03-19: qty 1

## 2022-03-19 MED ORDER — KETOROLAC TROMETHAMINE 30 MG/ML IJ SOLN: 30.0000 mg | Freq: Once | INTRAMUSCULAR | Status: DC | PRN

## 2022-03-19 MED ORDER — IOHEXOL 300 MG/ML  SOLN
INTRAMUSCULAR | Status: DC | PRN
  Administered 2022-03-19: 10 mL

## 2022-03-19 MED ORDER — GLYCOPYRROLATE PF 0.2 MG/ML IJ SOSY
PREFILLED_SYRINGE | INTRAMUSCULAR | Status: AC
  Filled 2022-03-19: qty 1

## 2022-03-19 MED ORDER — SUGAMMADEX SODIUM 200 MG/2ML IV SOLN
INTRAVENOUS | Status: DC | PRN
  Administered 2022-03-19: 200 mg via INTRAVENOUS

## 2022-03-19 MED ORDER — ROCURONIUM BROMIDE 10 MG/ML (PF) SYRINGE
PREFILLED_SYRINGE | INTRAVENOUS | Status: AC
  Filled 2022-03-19: qty 10

## 2022-03-19 SURGICAL SUPPLY — 51 items
BAG DRAIN URO-CYSTO SKYTR STRL (DRAIN) ×2 IMPLANT
BAG DRN RND TRDRP ANRFLXCHMBR (UROLOGICAL SUPPLIES) ×1
BAG DRN UROCATH (DRAIN) ×1
BAG URINE DRAIN 2000ML AR STRL (UROLOGICAL SUPPLIES) ×2 IMPLANT
BLADE CLIPPER SENSICLIP SURGIC (BLADE) ×2 IMPLANT
CATH FOLEY 2WAY SLVR  5CC 16FR (CATHETERS) ×1
CATH FOLEY 2WAY SLVR 5CC 16FR (CATHETERS) ×1 IMPLANT
CATH ROBINSON RED A/P 16FR (CATHETERS) IMPLANT
CATH ROBINSON RED A/P 20FR (CATHETERS) ×2 IMPLANT
CLOTH BEACON ORANGE TIMEOUT ST (SAFETY) ×2 IMPLANT
CNTNR URN SCR LID CUP LEK RST (MISCELLANEOUS) ×1 IMPLANT
CONT SPEC 4OZ STRL OR WHT (MISCELLANEOUS) ×2
COVER BACK TABLE 60X90IN (DRAPES) ×2 IMPLANT
COVER MAYO STAND STRL (DRAPES) ×2 IMPLANT
DRSG TEGADERM 4X4.75 (GAUZE/BANDAGES/DRESSINGS) ×2 IMPLANT
DRSG TEGADERM 8X12 (GAUZE/BANDAGES/DRESSINGS) ×2 IMPLANT
GEL ULTRASOUND 20GR AQUASONIC (MISCELLANEOUS) ×2 IMPLANT
GLOVE BIO SURGEON STRL SZ 6.5 (GLOVE) ×2 IMPLANT
GLOVE BIO SURGEON STRL SZ7.5 (GLOVE) ×4 IMPLANT
GLOVE BIO SURGEON STRL SZ8 (GLOVE) IMPLANT
GLOVE BIOGEL PI IND STRL 6.5 (GLOVE) IMPLANT
GLOVE BIOGEL PI INDICATOR 6.5 (GLOVE)
GLOVE SURG ORTHO 8.5 STRL (GLOVE) ×2 IMPLANT
GLOVE SURG SS PI 6.5 STRL IVOR (GLOVE) IMPLANT
GOWN STRL REUS W/TWL LRG LVL3 (GOWN DISPOSABLE) ×2 IMPLANT
GRID BRACH TEMP 18GA 2.8X3X.75 (MISCELLANEOUS) ×2 IMPLANT
HOLDER FOLEY CATH W/STRAP (MISCELLANEOUS) ×2 IMPLANT
IMPL SPACEOAR VUE SYSTEM (Spacer) ×1 IMPLANT
IMPLANT SPACEOAR VUE SYSTEM (Spacer) ×2 IMPLANT
IV NS 1000ML (IV SOLUTION) ×2
IV NS 1000ML BAXH (IV SOLUTION) ×1 IMPLANT
KIT TURNOVER CYSTO (KITS) ×2 IMPLANT
MANIFOLD NEPTUNE II (INSTRUMENTS) IMPLANT
NDL BRACHY 18G 5PK (NEEDLE) ×4 IMPLANT
NDL BRACHY 18G SINGLE (NEEDLE) IMPLANT
NDL PK MORGANSTERN STABILIZ (NEEDLE) ×1 IMPLANT
NEEDLE BRACHY 18G 5PK (NEEDLE) ×8 IMPLANT
NEEDLE BRACHY 18G SINGLE (NEEDLE) IMPLANT
NEEDLE PK MORGANSTERN STABILIZ (NEEDLE) ×2 IMPLANT
NS IRRIG 500ML POUR BTL (IV SOLUTION) IMPLANT
PACK CYSTO (CUSTOM PROCEDURE TRAY) ×2 IMPLANT
QUIOCK LINK SEEDS ×1 IMPLANT
SHEATH ULTRASOUND LF (SHEATH) IMPLANT
SHEATH ULTRASOUND LTX NONSTRL (SHEATH) IMPLANT
SUT BONE WAX W31G (SUTURE) IMPLANT
SYR 10ML LL (SYRINGE) ×2 IMPLANT
TOWEL OR 17X26 10 PK STRL BLUE (TOWEL DISPOSABLE) ×2 IMPLANT
TUBE CONNECTING 12X1/4 (SUCTIONS) IMPLANT
UNDERPAD 30X36 HEAVY ABSORB (UNDERPADS AND DIAPERS) ×4 IMPLANT
WATER STERILE IRR 3000ML UROMA (IV SOLUTION) ×2 IMPLANT
WATER STERILE IRR 500ML POUR (IV SOLUTION) ×2 IMPLANT

## 2022-03-19 NOTE — Discharge Instructions (Addendum)
You will be prescribed tamsulosin which is a medication to help you urinate over the next month.  You should call Dr. Lynne Logan office [phone]) if you feel you cannot empty your bladder well. Also, call if you develop fever > 101.  You will also be prescribed pain medication and should take an over the counter stool softener over the next week to avoid straining with bowel movements.  Followup with Dr. Alinda Money and your radiation oncologist as scheduled.   PROSTATE CANCER TREATMENT WITH RADIOACTIVE IODINE-125 SEED IMPLANT  This instruction sheet is intended to discuss implantation of Iodine-125 seeds as treatment for cancer of the prostate. It will explain in detail what you may expect from this treatment and what precautions are necessary as a result of the treatment. Iodine-125 emits a relatively low energy radiation. The radioactive seeds are surgically implanted directly into the prostate gland. Most of the radiation is contained within the prostate gland. A very small amount is present outside the body.The precautions that we ask you to take are to ensure that those around you are protected from unnecessary radiation. The principles of radiation safety that you need to understand are:  DISTANCE: The further a person is from the radioactive implant the less radiation they will be receiving. The amount of radiation received falls off quite rapidly with distance. More specific guidelines are given in the table on the last page.  TIME: The amount of radiation a person is exposed to is directly proportional to the amount of time that is spent in close proximity to the radioactive implant. Very little radiation will be received during short periods. See the table on the last page for more specific guideline.  CHILDREN UNDER AGE 64 Children should not be allowed to sit on your lap or otherwise be in very close contact for more than a few minutes for the first 6-8 weeks following the implant. You may  affectionately greet (hug/kiss) a child for a short period of time, but remember, the longer you are in close proximity with that child the more radiation they are being exposed to. At a distance of 6 feet there is no limit to the length of time you may spend together. See specific guidelines on the last page.  PREGNANT OR POSSIBLY PREGNANT WOMEN Pregnant women should avoid prolonged close physical contact with you for the first 6-8 weeks after implant. At a distance of 6 feet there is no limit to the length of time you may spend together. Pregnant women or possibly pregnant women can safely be in close contact with you for a limited period of time. See the last page for guidelines.  FAMILY RELATIONS You may sleep in the same bed as your partner (provided she is not pregnant or under the age of 25). Sexual intercourse, using a condom, may be resumed 2 weeks after the implant. Your semen may be discolored, dark brown or black. This is normal and is the result of bleeding that may have occurred during the implant. After 3-4 weeks it will not be necessary to use a condom.  DAILY ACTIVITIES You may resume normal activities in a few days (example: work, shopping, church) without the risk of harmful radiation exposure to those around you provided you keep in mind the time and distance precautions. Objects that you touch or item that you use do not become radioactive. Linens, clothing, tableware, and dishes may be used by other persons without special precautions. Your bodily wastes (urine and stool) are not radioactive.  SPECIAL PRECAUTIONS It is possible to lose implanted Iodine-125 seed(s) through urination. Although it is possible to pass seeds indefinitely, it is most likely to occur immediately after catheter removal. To prevent this from happening the catheter that was in place during the implant procedure is removed immediately after the implant and a cystoscopy procedure is performed. The process of  removing the catheter and the cystoscopy procedure should dislodge and remove any seeds that are not firmly imbedded in the prostate tissue. However, you should watch for seeds if/when you remove your catheter at home. The seeds are silver colored and the size of a grain of rice. In the unlikely event that a seed is seen after urination, simply flush the seed down the toilet. The seed should not be handled with your fingers, not even with a glove or napkin. A spoon or tweezers can be used to pick up a seed. The Radiation Oncology department is open Monday - Friday from 8:00 am to 5:30 pm with a Radiation Oncologist on call at all times. He or she may be reached by calling [phone]. If you are to be hospitalized or if death should occur, your family should notify the Runner, broadcasting/film/video.  SIDE EFFECTS There are very few side effects associate with the implant procedure. Minor burning with urination, weak stream, hesitancy, intermittency, frequency, mild pain or feeling unable to pass your urine freely are common and usually stop in one to four months. If these symptoms are extremely uncomfortable, contact your physician.  RADIATION SAFETY GUIDELINES PROSTATE CANCER TREATMENT WITH RADIOACTIVE IODINE-125 SEED IMPLANT  The following guidelines will limit exposure to less than naturally occurring background radiation.  PERSONS AGE 54-45 (if able to become pregnant)  FOR 8 WEEKS FOLLOWING IMPLANT  At a distance of 1 foot: limit time to less than 2 hours/week At a distance of 3 feet: limit time to 20 hours/week At a distance of 6 feet: no restrictions  AFTER 8 WEEKS No restrictions  CHILDREN UNDER AGE 54, PREGNANT WOMEN OR POSSIBLY PREGNANT WOMEN  FOR 8 WEEKS FOLLOWING IMPLANT At a distance of 1 foot: limit time to 10 minutes/week At a distance of 3 feet: limit time to 2 hours/week At a distance of 6 feet: no restrictions  AFTER 8 WEEKS No restrictions  PERSONS OVER THE AGE OF 45  AND DO NOT EXPECT TO HAVE ANY MORE CHILDREN No restrictions   Post Anesthesia Home Care Instructions  Activity: Get plenty of rest for the remainder of the day. A responsible individual must stay with you for 24 hours following the procedure.  For the next 24 hours, DO NOT: -Drive a car -Paediatric nurse -Drink alcoholic beverages -Take any medication unless instructed by your physician -Make any legal decisions or sign important papers.  Meals: Start with liquid foods such as gelatin or soup. Progress to regular foods as tolerated. Avoid greasy, spicy, heavy foods. If nausea and/or vomiting occur, drink only clear liquids until the nausea and/or vomiting subsides. Call your physician if vomiting continues.  Special Instructions/Symptoms: Your throat may feel dry or sore from the anesthesia or the breathing tube placed in your throat during surgery. If this causes discomfort, gargle with warm salt water. The discomfort should disappear within 24 hours.  No acetaminophen/Tylenol until after 3:15 pm today if needed.

## 2022-03-19 NOTE — Interval H&P Note (Signed)
History and Physical Interval Note:  03/19/2022 9:02 AM  Jay Green  has presented today for surgery, with the diagnosis of PROSTATE CANCER.  The various methods of treatment have been discussed with the patient and family. After consideration of risks, benefits and other options for treatment, the patient has consented to  Procedure(s): RADIOACTIVE SEED IMPLANT/BRACHYTHERAPY IMPLANT (N/A) SPACE OAR INSTILLATION (N/A) CYSTOSCOPY (N/A) as a surgical intervention.  The patient's history has been reviewed, patient examined, no change in status, stable for surgery.  I have reviewed the patient's chart and labs.  Questions were answered to the patient's satisfaction.     [employer]

## 2022-03-19 NOTE — Anesthesia Procedure Notes (Signed)
Procedure Name: Intubation Date/Time: 03/19/2022 10:39 AM  Performed by: Rogers Blocker, CRNAPre-anesthesia Checklist: Patient identified, Emergency Drugs available, Suction available and Patient being monitored Patient Re-evaluated:Patient Re-evaluated prior to induction Oxygen Delivery Method: Circle System Utilized Preoxygenation: Pre-oxygenation with 100% oxygen Induction Type: IV induction Ventilation: Mask ventilation without difficulty Laryngoscope Size: Mac and 4 Grade View: Grade II Tube type: Oral Tube size: 7.5 mm Number of attempts: 1 Airway Equipment and Method: Stylet and Bite block Placement Confirmation: ETT inserted through vocal cords under direct vision, positive ETCO2 and breath sounds checked- equal and bilateral Secured at: 21 cm Tube secured with: Tape Dental Injury: Teeth and Oropharynx as per pre-operative assessment

## 2022-03-19 NOTE — Anesthesia Postprocedure Evaluation (Signed)
Anesthesia Post Note  Patient: Jay Green  Procedure(s) Performed: RADIOACTIVE SEED IMPLANT/BRACHYTHERAPY IMPLANT SPACE OAR INSTILLATION CYSTOSCOPY     Patient location during evaluation: PACU Anesthesia Type: General Level of consciousness: awake and alert, oriented and patient cooperative Pain management: pain level controlled Vital Signs Assessment: post-procedure vital signs reviewed and stable Respiratory status: spontaneous breathing, nonlabored ventilation and respiratory function stable Cardiovascular status: blood pressure returned to baseline and stable Postop Assessment: no apparent nausea or vomiting Anesthetic complications: no   No notable events documented.  Last Vitals:  Vitals:   03/19/22 1200 03/19/22 1215  BP: (!) 119/104 120/82  Pulse: (!) 59 62  Resp: 18 (!) 24  Temp: (!) 36.4 C   SpO2: 100% 98%    Last Pain:  Vitals:   03/19/22 1215  TempSrc:   PainSc: 0-No pain                 Pervis Hocking

## 2022-03-19 NOTE — Anesthesia Preprocedure Evaluation (Addendum)
Anesthesia Evaluation  Patient identified by MRN, date of birth, ID band Patient awake    Reviewed: Allergy & Precautions, NPO status , Patient's Chart, lab work & pertinent test results  Airway Mallampati: II  TM Distance: >3 FB Neck ROM: Full    Dental  (+) Dental Advisory Given, Teeth Intact   Pulmonary Current Smoker and Patient abstained from smoking.,  Vapes occasionally, never a heavy smoker    Pulmonary exam normal breath sounds clear to auscultation       Cardiovascular hypertension (134/98 in preop), Pt. on medications Normal cardiovascular exam Rhythm:Regular Rate:Normal     Neuro/Psych PSYCHIATRIC DISORDERS Anxiety Depression negative neurological ROS     GI/Hepatic GERD  Medicated and Controlled,(+)     substance abuse  alcohol use, 2 fifths of alcohol per week    Endo/Other  negative endocrine ROS  Renal/GU negative Renal ROS  negative genitourinary   Musculoskeletal  (+) Arthritis , Osteoarthritis,    Abdominal   Peds  Hematology negative hematology ROS (+)   Anesthesia Other Findings Prostate ca  Reproductive/Obstetrics negative OB ROS                            Anesthesia Physical Anesthesia Plan  ASA: 2  Anesthesia Plan: General   Post-op Pain Management: Tylenol PO (pre-op)*   Induction: Intravenous  PONV Risk Score and Plan: 1 and Ondansetron, Dexamethasone, Midazolam and Treatment may vary due to age or medical condition  Airway Management Planned: Oral ETT  Additional Equipment: None  Intra-op Plan:   Post-operative Plan: Extubation in OR  Informed Consent: I have reviewed the patients History and Physical, chart, labs and discussed the procedure including the risks, benefits and alternatives for the proposed anesthesia with the patient or authorized representative who has indicated his/her understanding and acceptance.     Dental advisory  given  Plan Discussed with: CRNA  Anesthesia Plan Comments:      Anesthesia Quick Evaluation

## 2022-03-19 NOTE — Op Note (Signed)
Preoperative diagnosis: Clinically localized adenocarcinoma of the prostate (T1c Nx Mx)  Postoperative diagnosis: Clinically localized adenocarcinoma of the prostate  Procedure: 1) Transperineal placement of radioactive seeds into the prostate                    2) Cystoscopy                    3) Insertion of SpaceOAR hydrogel   Surgeon: Pryor Curia. M.D.  Radiation oncologist: Tyler Pita, M.D.  Anesthesia: General  EBL: Minimal  Complications: None  Indication: Jay Green is a 51 y.o. gentleman with clinically localized prostate cancer. After discussing management options for treatment, he elected to proceed with radiotherapy. He presents today for the above procedures. The potential risks, complications, alternative options, and expected recovery course have been discussed in detail with the patient and he has provided informed consent to proceed.  Description of procedure: The patient was taken to the operating room and general anesthesia was induced. He was administered preoperative antibiotics, placed in the dorsal lithotomy position, and prepped and draped in the usual sterile fashion. Next, intraoperative transrectal ultrasonography was utilized for real-time intraoperative planning by the radiation oncology team. Once the treatment plan was completed and the seed strands created, stranded iodine 125 radiation seeds were placed utilizing a brachytherapy perineal template. 71 radioactive iodine 125 seeds into the prostate through 18 catheter needles.  The brachytherapy template was then removed.  A site in the midline was selected on the perineum for placement of an 18 g needle with saline.  The needle was advanced above the rectum and below Denonvillier's fascia to the mid gland and confirmed to be in the midline on transverse imaging.  One cc of saline was injected confirming appropriate expansion of this space.  A total of 5 cc of saline was then injected to open the  space further bilaterally.  The saline syringe was then removed and the SpaceOAR hydrogel was injected with good distribution bilaterally. Position of the radiation seeds was confirmed on fluoroscopic imaging.  Flexible cystoscopy was then performed and no seeds were identified within the bladder.  No bladder tumors, stones, or other mucosal pathology was identified within the bladder. One radiation seed was noted extending through the prostatic urethra mucosa and was able to be removed from the spacer that it was attached to.  This was confirmed upon removal and the plan was run by Dr. Tammi Klippel again and he was satisfied that the overall plan was not significantly altered. He tolerated the procedure well and without complications. He was able to be transferred to the recovery unit in satisfactory condition.  He was given a voiding trial in the PACU.

## 2022-03-19 NOTE — Transfer of Care (Signed)
Immediate Anesthesia Transfer of Care Note  Patient: Jay Green  Procedure(s) Performed: RADIOACTIVE SEED IMPLANT/BRACHYTHERAPY IMPLANT SPACE OAR INSTILLATION CYSTOSCOPY  Patient Location: PACU  Anesthesia Type:General  Level of Consciousness: awake, alert , oriented and patient cooperative  Airway & Oxygen Therapy: Patient Spontanous Breathing with supplemental O2  Post-op Assessment: Report given to RN and Post -op Vital signs reviewed and stable  Post vital signs: Reviewed and stable  Last Vitals:  Vitals Value Taken Time  BP    Temp    Pulse 50 03/19/22 1202  Resp 16 03/19/22 1202  SpO2 100 % 03/19/22 1202  Vitals shown include unvalidated device data.  Last Pain:  Vitals:   03/19/22 0909  TempSrc: Oral  PainSc: 0-No pain      Patients Stated Pain Goal: 3 (95/74/73 4037)  Complications: No notable events documented.

## 2022-03-20 ENCOUNTER — Encounter (HOSPITAL_BASED_OUTPATIENT_CLINIC_OR_DEPARTMENT_OTHER): Payer: Self-pay | Admitting: Urology

## 2022-03-20 NOTE — Progress Notes (Signed)
  Radiation Oncology         [phone] ________________________________  Name: Jay Green MRN: [id number]  Date: 03/20/2022  DOB: Feb 03, 1971       Prostate Seed Implant  FX:[id number], No Pcp Per  No ref. provider found  DIAGNOSIS:  Oncology History  Malignant neoplasm of prostate ([place])  11/13/2021 Cancer Staging   Staging form: Prostate, AJCC 8th Edition - Clinical stage from 11/13/2021: Stage IIB (cT1c, cN0, cM0, PSA: 11.7, Grade Group: 2) - Signed by Freeman Caldron, PA-C on 12/16/2021 Histopathologic type: Adenocarcinoma, NOS Stage prefix: Initial diagnosis Prostate specific antigen (PSA) range: 10 to 19 Gleason primary pattern: 3 Gleason secondary pattern: 4 Gleason score: 7 Histologic grading system: 5 grade system Number of biopsy cores examined: 20 Number of biopsy cores positive: 16 Location of positive needle core biopsies: Both sides   12/16/2021 Initial Diagnosis   Malignant neoplasm of prostate (Jay Green)       ICD-10-CM   1. Hypertension, unspecified type  I10 CANCELED: EKG 12 lead per protocol      PROCEDURE: Insertion of radioactive I-125 seeds into the prostate gland.  RADIATION DOSE: 110 Gy, boost therapy.  TECHNIQUE: Hazen Brumett was brought to the operating room with the urologist. He was placed in the dorsolithotomy position. He was catheterized and a rectal tube was inserted. The perineum was shaved, prepped and draped. The ultrasound probe was then introduced into the rectum to see the prostate gland.  TREATMENT DEVICE: A needle grid was attached to the ultrasound probe stand and anchor needles were placed.  3D PLANNING: The prostate was imaged in 3D using a sagittal sweep of the prostate probe. These images were transferred to the planning computer. There, the prostate, urethra and rectum were defined on each axial reconstructed image. Then, the software created an optimized 3D plan and a few seed positions were adjusted. The quality of the plan was reviewed  using [facility] information for the target and the following two organs at risk:  Urethra and Rectum.  Then the accepted plan was printed and handed off to the radiation therapist.  Under my supervision, the custom loading of the seeds and spacers was carried out and loaded into sealed vicryl sleeves.  These pre-loaded needles were then placed into the needle holder.Marland Kitchen  PROSTATE VOLUME STUDY:  Using transrectal ultrasound the volume of the prostate was verified to be 30.5 cc.  SPECIAL TREATMENT PROCEDURE/SUPERVISION AND HANDLING: The pre-loaded needles were then delivered under sagittal guidance. A total of 18 needles were used to deposit 72 seeds in the prostate gland. The individual seed activity was 0.245 mCi.  SpaceOAR:  Yes  COMPLEX SIMULATION: At the end of the procedure, an anterior radiograph of the pelvis was obtained to document seed positioning and count. Cystoscopy was performed to check the urethra and bladder.  One seed was protruding into the bladder neck, it was removed leaving 71 seeds.  The seed was clearly identified in the intraop plan based on location, and it was removed from the radiation plan.  The quality of the plan was not compromised by the single seed changed.  MICRODOSIMETRY: At the end of the procedure, the patient was emitting 0.089 mR/hr at 1 meter. Accordingly, he was considered safe for hospital discharge.  PLAN: The patient will return to the radiation oncology clinic for post implant CT dosimetry in three weeks and then receive 45 Gy to the pelvis.   ________________________________  Sheral Apley. Tammi Klippel, M.D.

## 2022-03-31 ENCOUNTER — Telehealth: Payer: Self-pay | Admitting: *Deleted

## 2022-03-31 NOTE — Telephone Encounter (Signed)
Called patient to remind of sim appt. for 04-02-22- arrival time - 2:45 pm, spoke with patient and he is aware of this appt.

## 2022-04-02 ENCOUNTER — Other Ambulatory Visit: Payer: Self-pay

## 2022-04-02 ENCOUNTER — Ambulatory Visit
Admission: RE | Admit: 2022-04-02 | Discharge: 2022-04-02 | Disposition: A | Discharge status: Home / Self Care | Payer: BC Managed Care – PPO | Source: Ambulatory Visit | Attending: Radiation Oncology | Admitting: Radiation Oncology

## 2022-04-02 DIAGNOSIS — C61 Malignant neoplasm of prostate: Secondary | ICD-10-CM | POA: Diagnosis present

## 2022-04-02 DIAGNOSIS — Z510 Encounter for antineoplastic radiation therapy: Secondary | ICD-10-CM | POA: Diagnosis not present

## 2022-04-02 LAB — URINALYSIS, COMPLETE (UACMP) WITH MICROSCOPIC
Bacteria, UA: NONE SEEN
Bilirubin Urine: NEGATIVE
Glucose, UA: NEGATIVE mg/dL
Ketones, ur: NEGATIVE mg/dL
Leukocytes,Ua: NEGATIVE
Nitrite: NEGATIVE
Protein, ur: NEGATIVE mg/dL
Specific Gravity, Urine: 1.008 (ref 1.005–1.030)
pH: 5 (ref 5.0–8.0)

## 2022-04-03 ENCOUNTER — Telehealth: Payer: Self-pay | Admitting: *Deleted

## 2022-04-03 ENCOUNTER — Telehealth: Payer: Self-pay

## 2022-04-03 LAB — URINE CULTURE: Culture: NO GROWTH

## 2022-04-03 NOTE — Progress Notes (Signed)
Please call patient with normal result.  Thanks. MM 

## 2022-04-03 NOTE — Telephone Encounter (Signed)
RETURNED PATIENT'S PHONE CALL, LVM FOR A RETURN CALL 

## 2022-04-03 NOTE — Telephone Encounter (Signed)
Called patient about urinalysis results left message to call back.

## 2022-04-04 NOTE — Progress Notes (Signed)
  Radiation Oncology         [phone] ________________________________  Name: Jay Green MRN: [id number]  Date: 04/02/2022  DOB: Feb 06, 1971  SIMULATION AND TREATMENT PLANNING NOTE    ICD-10-CM   1. Malignant neoplasm of prostate ([place])  C61 Urinalysis, Complete w Microscopic    Urine culture      DIAGNOSIS:  51 y.o. gentleman with Stage T1c adenocarcinoma of the prostate with Gleason score of 3+4, and PSA of 11.7  NARRATIVE:  The patient was brought to the [facility].  Identity was confirmed.  All relevant records and images related to the planned course of therapy were reviewed.  The patient freely provided informed written consent to proceed with treatment after reviewing the details related to the planned course of therapy. The consent form was witnessed and verified by the simulation staff.  Then, the patient was set-up in a stable reproducible supine position for radiation therapy.  A vacuum lock pillow device was custom fabricated to position his legs in a reproducible immobilized position.  Then, I performed a urethrogram under sterile conditions to identify the prostatic apex.  CT images were obtained.  Surface markings were placed.  The CT images were loaded into the planning software.  Then the prostate target and avoidance structures including the rectum, bladder, bowel and hips were contoured.  Treatment planning then occurred.  The radiation prescription was entered and confirmed.  A total of one complex treatment devices were fabricated. I have requested : Intensity Modulated Radiotherapy (IMRT) is medically necessary for this case for the following reason:  Rectal sparing.Marland Kitchen  PLAN:  The patient will receive 45 Gy in 25 fractions of 1.8 Gy, to supplement an up-front prostate seed implant boost of 110 Gy to achieve a total nominal dose of 155 Gy.  ________________________________  Jay Green, M.D.

## 2022-04-04 NOTE — Progress Notes (Signed)
  Radiation Oncology         [phone] ________________________________  Name: Jay Green MRN: [id number]  Date: 04/02/2022  DOB: 1971-06-07  COMPLEX SIMULATION NOTE  NARRATIVE:  The patient was brought to the [facility] today following prostate seed implantation approximately one month ago.  Identity was confirmed.  All relevant records and images related to the planned course of therapy were reviewed.  Then, the patient was set-up supine.  CT images were obtained.  The CT images were loaded into the planning software.  Then the prostate and rectum were contoured.  Treatment planning then occurred.  The implanted iodine 125 seeds were identified by the physics staff for projection of radiation distribution  I have requested : 3D Simulation  I have requested a DVH of the following structures: Prostate and rectum.    ________________________________  Sheral Apley Tammi Klippel, M.D.

## 2022-04-06 ENCOUNTER — Telehealth: Payer: Self-pay | Admitting: *Deleted

## 2022-04-06 NOTE — Telephone Encounter (Signed)
RETURNED PATIENT'S PHONE CALL, SPOKE WITH PATIENT. ?

## 2022-04-07 ENCOUNTER — Telehealth: Payer: Self-pay | Admitting: *Deleted

## 2022-04-07 ENCOUNTER — Encounter: Payer: Self-pay | Admitting: Radiation Oncology

## 2022-04-07 DIAGNOSIS — Z510 Encounter for antineoplastic radiation therapy: Secondary | ICD-10-CM | POA: Diagnosis not present

## 2022-04-07 NOTE — Telephone Encounter (Signed)
Message received from Leone Brand [phone]) requesting "receipt status of [employer] FMLA paperwork sent for Dr. Tammi Klippel to complete.  To begin radiation Monday, 04/13/2022.  I have to drive four hours a day to get to work, two hours each way is why I need short term disability.  Call me back to let me know if paperwork has been received."  No paperwork received by forms staff.  Above message transferred to radiation.  This nurse left message for patient of above including the and the required [facility] Authorization for Use/Disclosure to sign for paperwork to be completed and [employer].  Provided main office fax number [phone] which forms staff has access to.   12:42 pm received return call from Leone Brand.  Confirmed providing [employer] with both fax numbers [phone] and [phone].  Provided [email or web address] to send [employer] form from APP or claim number for staff to inquire.

## 2022-04-08 DIAGNOSIS — Z510 Encounter for antineoplastic radiation therapy: Secondary | ICD-10-CM | POA: Diagnosis not present

## 2022-04-09 NOTE — Progress Notes (Signed)
  Radiation Oncology         [phone] ________________________________  Name: Jay Green MRN: [id number]  Date: 04/07/2022  DOB: September 13, 1971  3D Planning Note   Prostate Brachytherapy Post-Implant Dosimetry  Diagnosis: 51 y.o. 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 He underwent CT scan complex simulation to delineate the three-dimensional structures of the pelvis and demonstrate the radiation distribution.  Since that time, the seed localization, and complex isodose planning with dose volume histograms have now been completed.  Results:   Prostate Coverage - The dose of radiation delivered to the 90% or more of the prostate gland (D90) was 123% of the prescription dose. This exceeds our goal of greater than 90%. Rectal Sparing - The volume of rectal tissue receiving the prescription dose or higher was 0.0 cc. This falls under our thresholds tolerance of 1.0 cc.  Impression: The prostate seed implant appears to show adequate target coverage and appropriate rectal sparing.  Plan:  The patient will continue to follow with urology for ongoing PSA determinations. I would anticipate a high likelihood for local tumor control with minimal risk for rectal morbidity.  ________________________________  Sheral Apley Tammi Klippel, M.D.

## 2022-04-13 ENCOUNTER — Ambulatory Visit
Admission: RE | Admit: 2022-04-13 | Discharge: 2022-04-13 | Disposition: A | Discharge status: Home / Self Care | Payer: BC Managed Care – PPO | Source: Ambulatory Visit | Attending: Radiation Oncology | Admitting: Radiation Oncology

## 2022-04-13 ENCOUNTER — Other Ambulatory Visit: Payer: Self-pay

## 2022-04-13 DIAGNOSIS — Z510 Encounter for antineoplastic radiation therapy: Secondary | ICD-10-CM | POA: Diagnosis not present

## 2022-04-13 LAB — RAD ONC ARIA SESSION SUMMARY
Course Elapsed Days: 0
Plan Fractions Treated to Date: 1
Plan Prescribed Dose Per Fraction: 1.8 Gy
Plan Total Fractions Prescribed: 25
Plan Total Prescribed Dose: 45 Gy
Reference Point Dosage Given to Date: 1.8 Gy
Reference Point Session Dosage Given: 1.8 Gy
Session Number: 1

## 2022-04-14 ENCOUNTER — Other Ambulatory Visit: Payer: Self-pay

## 2022-04-14 ENCOUNTER — Ambulatory Visit
Admission: RE | Admit: 2022-04-14 | Discharge: 2022-04-14 | Disposition: A | Discharge status: Home / Self Care | Payer: BC Managed Care – PPO | Source: Ambulatory Visit | Attending: Radiation Oncology | Admitting: Radiation Oncology

## 2022-04-14 DIAGNOSIS — Z510 Encounter for antineoplastic radiation therapy: Secondary | ICD-10-CM | POA: Diagnosis not present

## 2022-04-14 LAB — RAD ONC ARIA SESSION SUMMARY
Course Elapsed Days: 1
Plan Fractions Treated to Date: 2
Plan Prescribed Dose Per Fraction: 1.8 Gy
Plan Total Fractions Prescribed: 25
Plan Total Prescribed Dose: 45 Gy
Reference Point Dosage Given to Date: 3.6 Gy
Reference Point Session Dosage Given: 1.8 Gy
Session Number: 2

## 2022-04-15 ENCOUNTER — Other Ambulatory Visit: Payer: Self-pay

## 2022-04-15 ENCOUNTER — Ambulatory Visit
Admission: RE | Admit: 2022-04-15 | Discharge: 2022-04-15 | Disposition: A | Discharge status: Home / Self Care | Payer: BC Managed Care – PPO | Source: Ambulatory Visit | Attending: Radiation Oncology | Admitting: Radiation Oncology

## 2022-04-15 DIAGNOSIS — Z510 Encounter for antineoplastic radiation therapy: Secondary | ICD-10-CM | POA: Diagnosis not present

## 2022-04-15 LAB — RAD ONC ARIA SESSION SUMMARY
Course Elapsed Days: 2
Plan Fractions Treated to Date: 3
Plan Prescribed Dose Per Fraction: 1.8 Gy
Plan Total Fractions Prescribed: 25
Plan Total Prescribed Dose: 45 Gy
Reference Point Dosage Given to Date: 5.4 Gy
Reference Point Session Dosage Given: 1.8 Gy
Session Number: 3

## 2022-04-16 ENCOUNTER — Other Ambulatory Visit: Payer: Self-pay

## 2022-04-16 ENCOUNTER — Ambulatory Visit
Admission: RE | Admit: 2022-04-16 | Discharge: 2022-04-16 | Disposition: A | Discharge status: Home / Self Care | Payer: BC Managed Care – PPO | Source: Ambulatory Visit | Attending: Radiation Oncology | Admitting: Radiation Oncology

## 2022-04-16 DIAGNOSIS — Z510 Encounter for antineoplastic radiation therapy: Secondary | ICD-10-CM | POA: Diagnosis not present

## 2022-04-16 LAB — RAD ONC ARIA SESSION SUMMARY
Course Elapsed Days: 3
Plan Fractions Treated to Date: 4
Plan Prescribed Dose Per Fraction: 1.8 Gy
Plan Total Fractions Prescribed: 25
Plan Total Prescribed Dose: 45 Gy
Reference Point Dosage Given to Date: 7.2 Gy
Reference Point Session Dosage Given: 1.8 Gy
Session Number: 4

## 2022-04-17 ENCOUNTER — Other Ambulatory Visit: Payer: Self-pay

## 2022-04-17 ENCOUNTER — Ambulatory Visit
Admission: RE | Admit: 2022-04-17 | Discharge: 2022-04-17 | Disposition: A | Discharge status: Home / Self Care | Payer: BC Managed Care – PPO | Source: Ambulatory Visit | Attending: Radiation Oncology | Admitting: Radiation Oncology

## 2022-04-17 DIAGNOSIS — Z510 Encounter for antineoplastic radiation therapy: Secondary | ICD-10-CM | POA: Diagnosis not present

## 2022-04-17 LAB — RAD ONC ARIA SESSION SUMMARY
Course Elapsed Days: 4
Plan Fractions Treated to Date: 5
Plan Prescribed Dose Per Fraction: 1.8 Gy
Plan Total Fractions Prescribed: 25
Plan Total Prescribed Dose: 45 Gy
Reference Point Dosage Given to Date: 9 Gy
Reference Point Session Dosage Given: 1.8 Gy
Session Number: 5

## 2022-04-20 ENCOUNTER — Ambulatory Visit: Payer: BC Managed Care – PPO

## 2022-04-21 ENCOUNTER — Other Ambulatory Visit: Payer: Self-pay

## 2022-04-21 ENCOUNTER — Ambulatory Visit
Admission: RE | Admit: 2022-04-21 | Discharge: 2022-04-21 | Disposition: A | Discharge status: Home / Self Care | Payer: BC Managed Care – PPO | Source: Ambulatory Visit | Attending: Urology | Admitting: Urology

## 2022-04-21 DIAGNOSIS — C61 Malignant neoplasm of prostate: Secondary | ICD-10-CM | POA: Insufficient documentation

## 2022-04-21 DIAGNOSIS — Z510 Encounter for antineoplastic radiation therapy: Secondary | ICD-10-CM | POA: Diagnosis not present

## 2022-04-21 LAB — RAD ONC ARIA SESSION SUMMARY
Course Elapsed Days: 8
Plan Fractions Treated to Date: 6
Plan Prescribed Dose Per Fraction: 1.8 Gy
Plan Total Fractions Prescribed: 25
Plan Total Prescribed Dose: 45 Gy
Reference Point Dosage Given to Date: 10.8 Gy
Reference Point Session Dosage Given: 1.8 Gy
Session Number: 6

## 2022-04-22 ENCOUNTER — Ambulatory Visit
Admission: RE | Admit: 2022-04-22 | Discharge: 2022-04-22 | Disposition: A | Discharge status: Home / Self Care | Payer: BC Managed Care – PPO | Source: Ambulatory Visit | Attending: Radiation Oncology | Admitting: Radiation Oncology

## 2022-04-22 ENCOUNTER — Other Ambulatory Visit: Payer: Self-pay

## 2022-04-22 DIAGNOSIS — Z510 Encounter for antineoplastic radiation therapy: Secondary | ICD-10-CM | POA: Diagnosis not present

## 2022-04-22 LAB — RAD ONC ARIA SESSION SUMMARY
Course Elapsed Days: 9
Plan Fractions Treated to Date: 7
Plan Prescribed Dose Per Fraction: 1.8 Gy
Plan Total Fractions Prescribed: 25
Plan Total Prescribed Dose: 45 Gy
Reference Point Dosage Given to Date: 12.6 Gy
Reference Point Session Dosage Given: 1.8 Gy
Session Number: 7

## 2022-04-23 ENCOUNTER — Other Ambulatory Visit: Payer: Self-pay

## 2022-04-23 ENCOUNTER — Ambulatory Visit
Admission: RE | Admit: 2022-04-23 | Discharge: 2022-04-23 | Disposition: A | Discharge status: Home / Self Care | Payer: BC Managed Care – PPO | Source: Ambulatory Visit | Attending: Radiation Oncology | Admitting: Radiation Oncology

## 2022-04-23 DIAGNOSIS — Z510 Encounter for antineoplastic radiation therapy: Secondary | ICD-10-CM | POA: Diagnosis not present

## 2022-04-23 LAB — RAD ONC ARIA SESSION SUMMARY
Course Elapsed Days: 10
Plan Fractions Treated to Date: 8
Plan Prescribed Dose Per Fraction: 1.8 Gy
Plan Total Fractions Prescribed: 25
Plan Total Prescribed Dose: 45 Gy
Reference Point Dosage Given to Date: 14.4 Gy
Reference Point Session Dosage Given: 1.8 Gy
Session Number: 8

## 2022-04-24 ENCOUNTER — Ambulatory Visit
Admission: RE | Admit: 2022-04-24 | Discharge: 2022-04-24 | Disposition: A | Discharge status: Home / Self Care | Payer: BC Managed Care – PPO | Source: Ambulatory Visit | Attending: Radiation Oncology | Admitting: Radiation Oncology

## 2022-04-24 ENCOUNTER — Other Ambulatory Visit: Payer: Self-pay

## 2022-04-24 DIAGNOSIS — Z510 Encounter for antineoplastic radiation therapy: Secondary | ICD-10-CM | POA: Diagnosis not present

## 2022-04-24 LAB — RAD ONC ARIA SESSION SUMMARY
Course Elapsed Days: 11
Plan Fractions Treated to Date: 9
Plan Prescribed Dose Per Fraction: 1.8 Gy
Plan Total Fractions Prescribed: 25
Plan Total Prescribed Dose: 45 Gy
Reference Point Dosage Given to Date: 16.2 Gy
Reference Point Session Dosage Given: 1.8 Gy
Session Number: 9

## 2022-04-27 ENCOUNTER — Other Ambulatory Visit: Payer: Self-pay

## 2022-04-27 ENCOUNTER — Ambulatory Visit
Admission: RE | Admit: 2022-04-27 | Discharge: 2022-04-27 | Disposition: A | Discharge status: Home / Self Care | Payer: BC Managed Care – PPO | Source: Ambulatory Visit | Attending: Radiation Oncology | Admitting: Radiation Oncology

## 2022-04-27 DIAGNOSIS — Z510 Encounter for antineoplastic radiation therapy: Secondary | ICD-10-CM | POA: Diagnosis not present

## 2022-04-27 LAB — RAD ONC ARIA SESSION SUMMARY
Course Elapsed Days: 14
Plan Fractions Treated to Date: 10
Plan Prescribed Dose Per Fraction: 1.8 Gy
Plan Total Fractions Prescribed: 25
Plan Total Prescribed Dose: 45 Gy
Reference Point Dosage Given to Date: 18 Gy
Reference Point Session Dosage Given: 1.8 Gy
Session Number: 10

## 2022-04-27 NOTE — Telephone Encounter (Signed)
[employer] Leave of Absence form completed today by this nurse.  Folder placed in PA and provider mail bin for review, signature and return to this nurse to process.

## 2022-04-27 NOTE — Telephone Encounter (Signed)
"  Jay Green [phone] (home) calling about leave of absence.   Thought we discussed being out of work thirty days after radiation completed for recovery.  Employer says I am to return to work (RTW) May 18, 2022.  [employer] Machine down one day so far.  May need more time."  Reviewed leave paperwork in queue to process.  Start date of 04/13/2022 noted on form.  No end date noted by this nurse.  Agreed "RTW" date June 20, 2022.   "Are there area's I need to complete for [employer] form?" "   Yes.  Will leave form at entry registration for him to complete, sign and initial employee sections of leave tomorrow morning.  Currently no further questions or needs.

## 2022-04-28 ENCOUNTER — Other Ambulatory Visit: Payer: Self-pay

## 2022-04-28 ENCOUNTER — Ambulatory Visit
Admission: RE | Admit: 2022-04-28 | Discharge: 2022-04-28 | Disposition: A | Discharge status: Home / Self Care | Payer: BC Managed Care – PPO | Source: Ambulatory Visit | Attending: Radiation Oncology | Admitting: Radiation Oncology

## 2022-04-28 DIAGNOSIS — Z510 Encounter for antineoplastic radiation therapy: Secondary | ICD-10-CM | POA: Diagnosis not present

## 2022-04-28 LAB — RAD ONC ARIA SESSION SUMMARY
Course Elapsed Days: 15
Plan Fractions Treated to Date: 11
Plan Prescribed Dose Per Fraction: 1.8 Gy
Plan Total Fractions Prescribed: 25
Plan Total Prescribed Dose: 45 Gy
Reference Point Dosage Given to Date: 19.8 Gy
Reference Point Session Dosage Given: 1.8 Gy
Session Number: 11

## 2022-04-29 ENCOUNTER — Other Ambulatory Visit: Payer: Self-pay

## 2022-04-29 ENCOUNTER — Ambulatory Visit
Admission: RE | Admit: 2022-04-29 | Discharge: 2022-04-29 | Disposition: A | Discharge status: Home / Self Care | Payer: BC Managed Care – PPO | Source: Ambulatory Visit | Attending: Radiation Oncology | Admitting: Radiation Oncology

## 2022-04-29 DIAGNOSIS — Z510 Encounter for antineoplastic radiation therapy: Secondary | ICD-10-CM | POA: Diagnosis not present

## 2022-04-29 LAB — RAD ONC ARIA SESSION SUMMARY
Course Elapsed Days: 16
Plan Fractions Treated to Date: 12
Plan Prescribed Dose Per Fraction: 1.8 Gy
Plan Total Fractions Prescribed: 25
Plan Total Prescribed Dose: 45 Gy
Reference Point Dosage Given to Date: 21.6 Gy
Reference Point Session Dosage Given: 1.8 Gy
Session Number: 12

## 2022-04-29 NOTE — Telephone Encounter (Addendum)
Paperwork received today from collaborative/provider.  Successfully returned via fax with initial consult note, 01/22/22 simulation note, medication list and 8/4/2023Radiation weekly treatment note.    Original copy to [facility] file folder behind registrar number one for patient pick up     Process completed with copy of [employer] form to bin designated for items to be scanned.   No further instructions received, actions required or performed by this nurse.

## 2022-04-30 ENCOUNTER — Ambulatory Visit
Admission: RE | Admit: 2022-04-30 | Discharge: 2022-04-30 | Disposition: A | Discharge status: Home / Self Care | Payer: BC Managed Care – PPO | Source: Ambulatory Visit | Attending: Radiation Oncology | Admitting: Radiation Oncology

## 2022-04-30 ENCOUNTER — Other Ambulatory Visit: Payer: Self-pay

## 2022-04-30 DIAGNOSIS — Z510 Encounter for antineoplastic radiation therapy: Secondary | ICD-10-CM | POA: Diagnosis not present

## 2022-04-30 LAB — RAD ONC ARIA SESSION SUMMARY
Course Elapsed Days: 17
Plan Fractions Treated to Date: 13
Plan Prescribed Dose Per Fraction: 1.8 Gy
Plan Total Fractions Prescribed: 25
Plan Total Prescribed Dose: 45 Gy
Reference Point Dosage Given to Date: 23.4 Gy
Reference Point Session Dosage Given: 1.8 Gy
Session Number: 13

## 2022-04-30 NOTE — Telephone Encounter (Signed)
Messages received today regarding [employer] paperwork.   Connected with [employer] collaborative to ensure copy given to their staff for EMR scanning. Connected with Suresh Audi [phone] (home) to confirm the copy has been faxed to [facility].  Copy picked up earlier today is for his records.

## 2022-05-01 ENCOUNTER — Ambulatory Visit
Admission: RE | Admit: 2022-05-01 | Discharge: 2022-05-01 | Disposition: A | Discharge status: Home / Self Care | Payer: BC Managed Care – PPO | Source: Ambulatory Visit | Attending: Radiation Oncology | Admitting: Radiation Oncology

## 2022-05-01 ENCOUNTER — Other Ambulatory Visit: Payer: Self-pay

## 2022-05-01 DIAGNOSIS — Z510 Encounter for antineoplastic radiation therapy: Secondary | ICD-10-CM | POA: Diagnosis not present

## 2022-05-01 LAB — RAD ONC ARIA SESSION SUMMARY
Course Elapsed Days: 18
Plan Fractions Treated to Date: 14
Plan Prescribed Dose Per Fraction: 1.8 Gy
Plan Total Fractions Prescribed: 25
Plan Total Prescribed Dose: 45 Gy
Reference Point Dosage Given to Date: 25.2 Gy
Reference Point Session Dosage Given: 1.8 Gy
Session Number: 14

## 2022-05-04 ENCOUNTER — Other Ambulatory Visit: Payer: Self-pay

## 2022-05-04 ENCOUNTER — Ambulatory Visit
Admission: RE | Admit: 2022-05-04 | Discharge: 2022-05-04 | Disposition: A | Discharge status: Home / Self Care | Payer: BC Managed Care – PPO | Source: Ambulatory Visit | Attending: Radiation Oncology | Admitting: Radiation Oncology

## 2022-05-04 DIAGNOSIS — Z510 Encounter for antineoplastic radiation therapy: Secondary | ICD-10-CM | POA: Diagnosis not present

## 2022-05-04 LAB — RAD ONC ARIA SESSION SUMMARY
Course Elapsed Days: 21
Plan Fractions Treated to Date: 15
Plan Prescribed Dose Per Fraction: 1.8 Gy
Plan Total Fractions Prescribed: 25
Plan Total Prescribed Dose: 45 Gy
Reference Point Dosage Given to Date: 27 Gy
Reference Point Session Dosage Given: 1.8 Gy
Session Number: 15

## 2022-05-05 ENCOUNTER — Ambulatory Visit
Admission: RE | Admit: 2022-05-05 | Discharge: 2022-05-05 | Disposition: A | Discharge status: Home / Self Care | Payer: BC Managed Care – PPO | Source: Ambulatory Visit | Attending: Radiation Oncology | Admitting: Radiation Oncology

## 2022-05-05 ENCOUNTER — Other Ambulatory Visit: Payer: Self-pay

## 2022-05-05 DIAGNOSIS — Z510 Encounter for antineoplastic radiation therapy: Secondary | ICD-10-CM | POA: Diagnosis not present

## 2022-05-05 LAB — RAD ONC ARIA SESSION SUMMARY
Course Elapsed Days: 22
Plan Fractions Treated to Date: 16
Plan Prescribed Dose Per Fraction: 1.8 Gy
Plan Total Fractions Prescribed: 25
Plan Total Prescribed Dose: 45 Gy
Reference Point Dosage Given to Date: 28.8 Gy
Reference Point Session Dosage Given: 1.8 Gy
Session Number: 16

## 2022-05-06 ENCOUNTER — Other Ambulatory Visit: Payer: Self-pay

## 2022-05-06 ENCOUNTER — Ambulatory Visit
Admission: RE | Admit: 2022-05-06 | Discharge: 2022-05-06 | Disposition: A | Discharge status: Home / Self Care | Payer: BC Managed Care – PPO | Source: Ambulatory Visit | Attending: Radiation Oncology | Admitting: Radiation Oncology

## 2022-05-06 DIAGNOSIS — Z510 Encounter for antineoplastic radiation therapy: Secondary | ICD-10-CM | POA: Diagnosis not present

## 2022-05-06 LAB — RAD ONC ARIA SESSION SUMMARY
Course Elapsed Days: 23
Plan Fractions Treated to Date: 17
Plan Prescribed Dose Per Fraction: 1.8 Gy
Plan Total Fractions Prescribed: 25
Plan Total Prescribed Dose: 45 Gy
Reference Point Dosage Given to Date: 30.6 Gy
Reference Point Session Dosage Given: 1.8 Gy
Session Number: 17

## 2022-05-07 ENCOUNTER — Other Ambulatory Visit: Payer: Self-pay

## 2022-05-07 ENCOUNTER — Ambulatory Visit
Admission: RE | Admit: 2022-05-07 | Discharge: 2022-05-07 | Disposition: A | Discharge status: Home / Self Care | Payer: BC Managed Care – PPO | Source: Ambulatory Visit | Attending: Radiation Oncology | Admitting: Radiation Oncology

## 2022-05-07 DIAGNOSIS — Z510 Encounter for antineoplastic radiation therapy: Secondary | ICD-10-CM | POA: Diagnosis not present

## 2022-05-07 LAB — RAD ONC ARIA SESSION SUMMARY
Course Elapsed Days: 24
Plan Fractions Treated to Date: 18
Plan Prescribed Dose Per Fraction: 1.8 Gy
Plan Total Fractions Prescribed: 25
Plan Total Prescribed Dose: 45 Gy
Reference Point Dosage Given to Date: 32.4 Gy
Reference Point Session Dosage Given: 1.8 Gy
Session Number: 18

## 2022-05-08 ENCOUNTER — Other Ambulatory Visit: Payer: Self-pay

## 2022-05-08 ENCOUNTER — Ambulatory Visit
Admission: RE | Admit: 2022-05-08 | Discharge: 2022-05-08 | Disposition: A | Discharge status: Home / Self Care | Payer: BC Managed Care – PPO | Source: Ambulatory Visit | Attending: Radiation Oncology | Admitting: Radiation Oncology

## 2022-05-08 DIAGNOSIS — Z510 Encounter for antineoplastic radiation therapy: Secondary | ICD-10-CM | POA: Diagnosis not present

## 2022-05-08 LAB — RAD ONC ARIA SESSION SUMMARY
Course Elapsed Days: 25
Plan Fractions Treated to Date: 19
Plan Prescribed Dose Per Fraction: 1.8 Gy
Plan Total Fractions Prescribed: 25
Plan Total Prescribed Dose: 45 Gy
Reference Point Dosage Given to Date: 34.2 Gy
Reference Point Session Dosage Given: 1.8 Gy
Session Number: 19

## 2022-05-11 ENCOUNTER — Other Ambulatory Visit: Payer: Self-pay

## 2022-05-11 ENCOUNTER — Ambulatory Visit
Admission: RE | Admit: 2022-05-11 | Discharge: 2022-05-11 | Disposition: A | Discharge status: Home / Self Care | Payer: BC Managed Care – PPO | Source: Ambulatory Visit | Attending: Radiation Oncology | Admitting: Radiation Oncology

## 2022-05-11 DIAGNOSIS — Z510 Encounter for antineoplastic radiation therapy: Secondary | ICD-10-CM | POA: Diagnosis not present

## 2022-05-11 LAB — RAD ONC ARIA SESSION SUMMARY
Course Elapsed Days: 28
Plan Fractions Treated to Date: 20
Plan Prescribed Dose Per Fraction: 1.8 Gy
Plan Total Fractions Prescribed: 25
Plan Total Prescribed Dose: 45 Gy
Reference Point Dosage Given to Date: 36 Gy
Reference Point Session Dosage Given: 1.8 Gy
Session Number: 20

## 2022-05-12 ENCOUNTER — Other Ambulatory Visit: Payer: Self-pay

## 2022-05-12 ENCOUNTER — Ambulatory Visit
Admission: RE | Admit: 2022-05-12 | Discharge: 2022-05-12 | Disposition: A | Discharge status: Home / Self Care | Payer: BC Managed Care – PPO | Source: Ambulatory Visit | Attending: Radiation Oncology | Admitting: Radiation Oncology

## 2022-05-12 DIAGNOSIS — Z510 Encounter for antineoplastic radiation therapy: Secondary | ICD-10-CM | POA: Diagnosis not present

## 2022-05-12 LAB — RAD ONC ARIA SESSION SUMMARY
Course Elapsed Days: 29
Plan Fractions Treated to Date: 21
Plan Prescribed Dose Per Fraction: 1.8 Gy
Plan Total Fractions Prescribed: 25
Plan Total Prescribed Dose: 45 Gy
Reference Point Dosage Given to Date: 37.8 Gy
Reference Point Session Dosage Given: 1.8 Gy
Session Number: 21

## 2022-05-13 ENCOUNTER — Ambulatory Visit
Admission: RE | Admit: 2022-05-13 | Discharge: 2022-05-13 | Disposition: A | Discharge status: Home / Self Care | Payer: BC Managed Care – PPO | Source: Ambulatory Visit | Attending: Radiation Oncology | Admitting: Radiation Oncology

## 2022-05-13 ENCOUNTER — Other Ambulatory Visit: Payer: Self-pay

## 2022-05-13 DIAGNOSIS — Z510 Encounter for antineoplastic radiation therapy: Secondary | ICD-10-CM | POA: Diagnosis not present

## 2022-05-13 LAB — RAD ONC ARIA SESSION SUMMARY
Course Elapsed Days: 30
Plan Fractions Treated to Date: 22
Plan Prescribed Dose Per Fraction: 1.8 Gy
Plan Total Fractions Prescribed: 25
Plan Total Prescribed Dose: 45 Gy
Reference Point Dosage Given to Date: 39.6 Gy
Reference Point Session Dosage Given: 1.8 Gy
Session Number: 22

## 2022-05-14 ENCOUNTER — Other Ambulatory Visit: Payer: Self-pay

## 2022-05-14 ENCOUNTER — Ambulatory Visit
Admission: RE | Admit: 2022-05-14 | Discharge: 2022-05-14 | Disposition: A | Discharge status: Home / Self Care | Payer: BC Managed Care – PPO | Source: Ambulatory Visit | Attending: Radiation Oncology | Admitting: Radiation Oncology

## 2022-05-14 DIAGNOSIS — Z510 Encounter for antineoplastic radiation therapy: Secondary | ICD-10-CM | POA: Diagnosis not present

## 2022-05-14 LAB — RAD ONC ARIA SESSION SUMMARY
Course Elapsed Days: 31
Plan Fractions Treated to Date: 23
Plan Prescribed Dose Per Fraction: 1.8 Gy
Plan Total Fractions Prescribed: 25
Plan Total Prescribed Dose: 45 Gy
Reference Point Dosage Given to Date: 41.4 Gy
Reference Point Session Dosage Given: 1.8 Gy
Session Number: 23

## 2022-05-14 NOTE — Progress Notes (Signed)
Pt expressed interested in wanting to obtain a PSA prior to his follow up with Dr. Alinda Money @ [facility] on 3/8.   Patient will complete his radiation treatment on 8/28 for his stage T1c adenocarcinoma of the prostate with Gleason Score of 3+4, and PSA of 11.7.    RN reviewed with Dr. Alinda Money, Dr. Alinda Money placed request to staff to have appointment moved up to late Jan/early Feb; however does not want to check PSA to early post treatment.   RN spoke with patient, provided education on PSA check post treatment.  Verbalized understanding and agreement.  No further needs at this time.

## 2022-05-15 ENCOUNTER — Ambulatory Visit
Admission: RE | Admit: 2022-05-15 | Discharge: 2022-05-15 | Disposition: A | Discharge status: Home / Self Care | Payer: BC Managed Care – PPO | Source: Ambulatory Visit | Attending: Radiation Oncology | Admitting: Radiation Oncology

## 2022-05-15 ENCOUNTER — Other Ambulatory Visit: Payer: Self-pay

## 2022-05-15 DIAGNOSIS — Z510 Encounter for antineoplastic radiation therapy: Secondary | ICD-10-CM | POA: Diagnosis not present

## 2022-05-15 LAB — RAD ONC ARIA SESSION SUMMARY
Course Elapsed Days: 32
Plan Fractions Treated to Date: 24
Plan Prescribed Dose Per Fraction: 1.8 Gy
Plan Total Fractions Prescribed: 25
Plan Total Prescribed Dose: 45 Gy
Reference Point Dosage Given to Date: 43.2 Gy
Reference Point Session Dosage Given: 1.8 Gy
Session Number: 24

## 2022-05-18 ENCOUNTER — Other Ambulatory Visit: Payer: Self-pay

## 2022-05-18 ENCOUNTER — Encounter: Payer: Self-pay | Admitting: Urology

## 2022-05-18 ENCOUNTER — Ambulatory Visit
Admission: RE | Admit: 2022-05-18 | Discharge: 2022-05-18 | Disposition: A | Discharge status: Home / Self Care | Payer: BC Managed Care – PPO | Source: Ambulatory Visit | Attending: Radiation Oncology | Admitting: Radiation Oncology

## 2022-05-18 DIAGNOSIS — C61 Malignant neoplasm of prostate: Secondary | ICD-10-CM

## 2022-05-18 DIAGNOSIS — Z510 Encounter for antineoplastic radiation therapy: Secondary | ICD-10-CM | POA: Diagnosis not present

## 2022-05-18 LAB — RAD ONC ARIA SESSION SUMMARY
Course Elapsed Days: 35
Plan Fractions Treated to Date: 25
Plan Prescribed Dose Per Fraction: 1.8 Gy
Plan Total Fractions Prescribed: 25
Plan Total Prescribed Dose: 45 Gy
Reference Point Dosage Given to Date: 45 Gy
Reference Point Session Dosage Given: 1.8 Gy
Session Number: 25

## 2022-06-16 ENCOUNTER — Telehealth: Payer: Self-pay

## 2022-06-16 NOTE — Telephone Encounter (Signed)
Patient called needed return to work letter.  Copy was faxed to his wife and copy sent through to his myChart.

## 2022-06-16 NOTE — Progress Notes (Signed)
RN spoke with patient.  Patient did not receive work release letter that was sent earlier today.   RN re-faxed to [phone], pt aware.  No further needs at this time.

## 2022-07-01 ENCOUNTER — Encounter: Payer: Self-pay | Admitting: Urology

## 2022-07-01 NOTE — Progress Notes (Addendum)
  Radiation Oncology         [phone] ________________________________  Name: Jay Green MRN: [id number]  Date of Service: 07/02/2022  DOB: 11/14/1970  Post Treatment Telephone Note  Diagnosis:  Stage T1c adenocarcinoma of the prostate with Gleason score of 3+4, and PSA of 11.7 (as documented in provider EOT note)   Pre Treatment IPSS Score: 2   The patient was available for call today.   Symptoms of fatigue have improved since completing therapy.  Symptoms of bladder changes have  improved since completing therapy. Current symptoms include dysuria 2/10 and medications for bladder symptoms include Flomax.  Symptoms of bowel changes have  improved since completing therapy. Current symptoms include mild diarrhea, and medications for bowel symptoms include Probiotic.     Post Treatment IPSS Score: IPSS Questionnaire (AUA-7): Over the past month.   1)  How often have you had a sensation of not emptying your bladder completely after you finish urinating?  0 - Not at all  2)  How often have you had to urinate again less than two hours after you finished urinating? 0 - Not at all  3)  How often have you found you stopped and started again several times when you urinated?  0 - Not at all  4) How difficult have you found it to postpone urination?  0 - Not at all  5) How often have you had a weak urinary stream?  0 - Not at all  6) How often have you had to push or strain to begin urination?  0 - Not at all  7) How many times did you most typically get up to urinate from the time you went to bed until the time you got up in the morning?  2 - 2 times  Total score:  2. Which indicates mild symptoms  0-7 mildly symptomatic   8-19 moderately symptomatic   20-35 severely symptomatic   Urology appt- January 31st, 2023 At [facility] w/ Dr. Tresa Moore  Patient was advised to call to schedule their post-treatment follow up with his urologist Dr. Tresa Moore for ongoing surveillance. He was  counseled that PSA levels will be drawn in his urologist office, and was reassured that additional time is expected to improve bowel and bladder symptoms. He was encouraged to call back with concerns or questions regarding radiation.  This concludes the nursing interview.   Leandra Kern, LPN

## 2022-07-02 ENCOUNTER — Ambulatory Visit
Admission: RE | Admit: 2022-07-02 | Discharge: 2022-07-02 | Disposition: A | Discharge status: Home / Self Care | Payer: BC Managed Care – PPO | Source: Ambulatory Visit | Attending: Urology | Admitting: Urology

## 2022-07-02 DIAGNOSIS — C61 Malignant neoplasm of prostate: Secondary | ICD-10-CM | POA: Insufficient documentation

## 2022-07-02 NOTE — Progress Notes (Signed)
  Radiation Oncology         [phone] ________________________________  Name: Jay Green MRN: [id number]  Date: 05/18/2022  DOB: [birth year]  End of Treatment Note  Diagnosis:    51 y.o. gentleman with Stage T1c adenocarcinoma of the prostate with Gleason score of 3+4, and PSA of 11.7      Indication for treatment:  Curative, Definitive Radiotherapy       Radiation treatment dates:    03/19/22: Brachytherapy boost 04/13/22 - 05/18/22: IMRT prostate and pelvis  Site/dose:  1.    Insertion of radioactive I-125 seeds into the prostate gland; 110 Gy, boost therapy.   2. The prostate, seminal vesicles, and pelvic lymph nodes were treated to 45 Gy in 25 fractions of 1.8 Gy, to supplement an up-front prostate seed implant boost of 110 Gy to achieve a total nominal dose of 155 Gy.  Beams/energy:  1.  A total of 18 needles were used to deposit 72 seeds in the prostate gland. The individual seed activity was 0.245 mCi.   2. The prostate, seminal vesicles, and pelvic lymph nodes were treated using VMAT intensity modulated radiotherapy delivering 6 megavolt photons. Image guidance was performed with CB-CT studies prior to each fraction. He was immobilized with a body fix lower extremity mold.   Narrative: The patient tolerated radiation treatment relatively well with only minor urinary irritation and modest fatigue.  He did report increased nocturia 3x/night and dysuria which were improved with AZO and Flomax. He also reported diarrhea that was managed with Imodium prn.  Plan: The patient has completed radiation treatment. He will return to radiation oncology clinic for routine followup in one month. I advised him to call or return sooner if he has any questions or concerns related to his recovery or treatment. ________________________________  Sheral Apley. Tammi Klippel, M.D.

## 2022-09-01 ENCOUNTER — Encounter: Payer: Self-pay | Admitting: *Deleted

## 2022-09-18 ENCOUNTER — Encounter: Payer: Self-pay | Admitting: *Deleted

## 2022-10-13 ENCOUNTER — Inpatient Hospital Stay: Payer: BC Managed Care – PPO | Attending: Adult Health | Admitting: *Deleted

## 2022-10-13 DIAGNOSIS — C61 Malignant neoplasm of prostate: Secondary | ICD-10-CM

## 2023-06-09 IMAGING — PT NM PET TUM IMG SKULL BASE T - THIGH
1 of 7 series · 1 of 25 positions shown · non-contrast
Comparison: MRI October 18, 2021

CLINICAL DATA: Initial treatment strategy for tumor type.

EXAM:
NUCLEAR MEDICINE PET SKULL BASE TO THIGH
TECHNIQUE: 9.3 mCi F18 Piflufolastat (Pylarify) was injected intravenously.
Full-ring PET imaging was performed from the skull base to thigh
after the radiotracer. CT data was obtained and used for attenuation
correction and anatomic localization.

[Series 4: ct sk_thigh 5.0 bf37 · axial · 5.0mm · 0.98mm/px · 1 of 264 slices shown]
[im 211/264  brain]
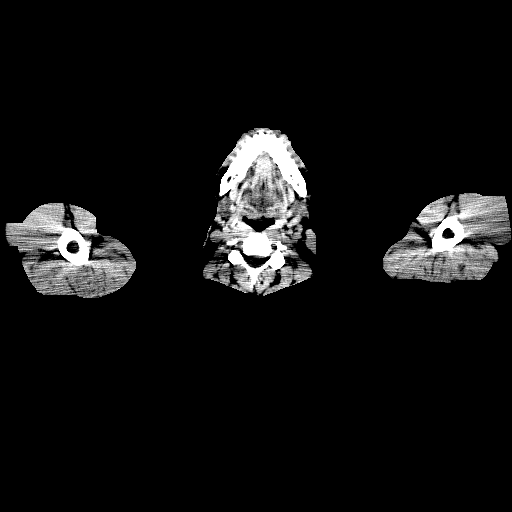

[1 of 25 positions shown; findings below may reference images not displayed]

FINDINGS: Background hepatic uptake demonstrated mass SUV of 8.2.

Background parotid gland uptake demonstrates a max SUV of

NECK

No radiotracer activity in neck lymph nodes.

Incidental CT finding: Streak artifact from dental hardware

CHEST

No radiotracer accumulation within mediastinal or hilar lymph nodes.
No suspicious pulmonary nodules on the CT scan.

Incidental CT finding: None

ABDOMEN/PELVIS

Prostate: There are 2 intense foci of abnormal radiotracer uptake in
the prostate gland, with uptake similar to that of background liver,
both of which appear to correspond with the suspicious PI-RADS [DATE]
nodules seen on prior MRI. The max SUV of the focus in the left
gland is 8.6 in the max SUV of the focus in the right gland a 7
point

Lymph nodes: No abnormal radiotracer accumulation within pelvic or
abdominal nodes.

Liver: No evidence of liver metastasis

Incidental CT finding: Unremarkable noncontrast appearance of the
hepatic, pancreatic and splenic parenchyma. No hydronephrosis.
Scattered colonic diverticulosis without findings of acute
diverticulitis.

SKELETON

No focal  activity to suggest skeletal metastasis.
IMPRESSION: 1. Two foci of abnormal radiotracer uptake in the prostate gland
both of which demonstrate uptake similar to that of background liver
and correspond with the suspicious lesion seen on prior MRI.

2. No abnormal extraprostatic radiotracer uptake to suggest
metastatic disease.
